# Patient Record
Sex: Female | Born: 1980 | Race: Black or African American | Hispanic: No | Smoking: Never smoker
Health system: Southern US, Community
[De-identification: ages and names within clinical notes are randomized; demographics above are authoritative.]

## PROBLEM LIST (undated history)

## (undated) ENCOUNTER — Ambulatory Visit (HOSPITAL_COMMUNITY): Admission: EM | Payer: 59

## (undated) DIAGNOSIS — E559 Vitamin D deficiency, unspecified: Secondary | ICD-10-CM

## (undated) DIAGNOSIS — R3589 Other polyuria: Secondary | ICD-10-CM

## (undated) DIAGNOSIS — R079 Chest pain, unspecified: Secondary | ICD-10-CM

## (undated) DIAGNOSIS — R5383 Other fatigue: Secondary | ICD-10-CM

## (undated) DIAGNOSIS — I10 Essential (primary) hypertension: Secondary | ICD-10-CM

## (undated) HISTORY — DX: Chest pain, unspecified: R07.9

## (undated) HISTORY — DX: Vitamin D deficiency, unspecified: E55.9

## (undated) HISTORY — DX: Essential (primary) hypertension: I10

## (undated) HISTORY — DX: Other polyuria: R35.89

## (undated) HISTORY — DX: Other fatigue: R53.83

---

## 2000-02-14 ENCOUNTER — Encounter: Admission: RE | Admit: 2000-02-14 | Discharge: 2000-02-14 | Payer: Self-pay | Admitting: General Practice

## 2000-02-14 ENCOUNTER — Encounter: Payer: Self-pay | Admitting: General Practice

## 2001-02-13 ENCOUNTER — Ambulatory Visit (HOSPITAL_COMMUNITY): Admission: RE | Admit: 2001-02-13 | Discharge: 2001-02-13 | Payer: Self-pay | Admitting: *Deleted

## 2001-06-15 ENCOUNTER — Inpatient Hospital Stay (HOSPITAL_COMMUNITY): Admission: AD | Admit: 2001-06-15 | Discharge: 2001-06-17 | Payer: Self-pay | Admitting: *Deleted

## 2002-03-29 ENCOUNTER — Emergency Department (HOSPITAL_COMMUNITY): Admission: EM | Admit: 2002-03-29 | Discharge: 2002-03-29 | Payer: Self-pay | Admitting: Emergency Medicine

## 2004-01-03 ENCOUNTER — Emergency Department (HOSPITAL_COMMUNITY): Admission: EM | Admit: 2004-01-03 | Discharge: 2004-01-03 | Payer: Self-pay | Admitting: Emergency Medicine

## 2004-02-19 ENCOUNTER — Ambulatory Visit (HOSPITAL_COMMUNITY): Admission: AD | Admit: 2004-02-19 | Discharge: 2004-02-19 | Payer: Self-pay | Admitting: Obstetrics & Gynecology

## 2004-02-19 ENCOUNTER — Encounter (INDEPENDENT_AMBULATORY_CARE_PROVIDER_SITE_OTHER): Payer: Self-pay | Admitting: *Deleted

## 2004-08-09 ENCOUNTER — Ambulatory Visit: Payer: Self-pay | Admitting: Obstetrics & Gynecology

## 2004-08-09 ENCOUNTER — Inpatient Hospital Stay (HOSPITAL_COMMUNITY): Admission: AD | Admit: 2004-08-09 | Discharge: 2004-08-09 | Payer: Self-pay | Admitting: Obstetrics and Gynecology

## 2004-08-26 ENCOUNTER — Emergency Department (HOSPITAL_COMMUNITY): Admission: EM | Admit: 2004-08-26 | Discharge: 2004-08-26 | Payer: Self-pay | Admitting: Emergency Medicine

## 2004-09-29 ENCOUNTER — Emergency Department (HOSPITAL_COMMUNITY): Admission: EM | Admit: 2004-09-29 | Discharge: 2004-09-29 | Payer: Self-pay | Admitting: Emergency Medicine

## 2004-11-05 ENCOUNTER — Ambulatory Visit (HOSPITAL_COMMUNITY): Admission: RE | Admit: 2004-11-05 | Discharge: 2004-11-05 | Payer: Self-pay | Admitting: *Deleted

## 2004-12-05 ENCOUNTER — Inpatient Hospital Stay (HOSPITAL_COMMUNITY): Admission: AD | Admit: 2004-12-05 | Discharge: 2004-12-05 | Payer: Self-pay | Admitting: *Deleted

## 2004-12-10 ENCOUNTER — Ambulatory Visit (HOSPITAL_COMMUNITY): Admission: RE | Admit: 2004-12-10 | Discharge: 2004-12-10 | Payer: Self-pay | Admitting: *Deleted

## 2005-05-06 ENCOUNTER — Ambulatory Visit: Payer: Self-pay | Admitting: Family Medicine

## 2005-05-06 ENCOUNTER — Inpatient Hospital Stay (HOSPITAL_COMMUNITY): Admission: AD | Admit: 2005-05-06 | Discharge: 2005-05-08 | Payer: Self-pay | Admitting: Family Medicine

## 2005-05-19 IMAGING — CR DG CHEST 2V
2 series · 2 of 2 positions shown · non-contrast
Comparison: None.

CLINICAL DATA: Chest pain, shortness of breath.

CHEST - 2 VIEW  08/26/2004:

[view not recorded (1 of 2)]
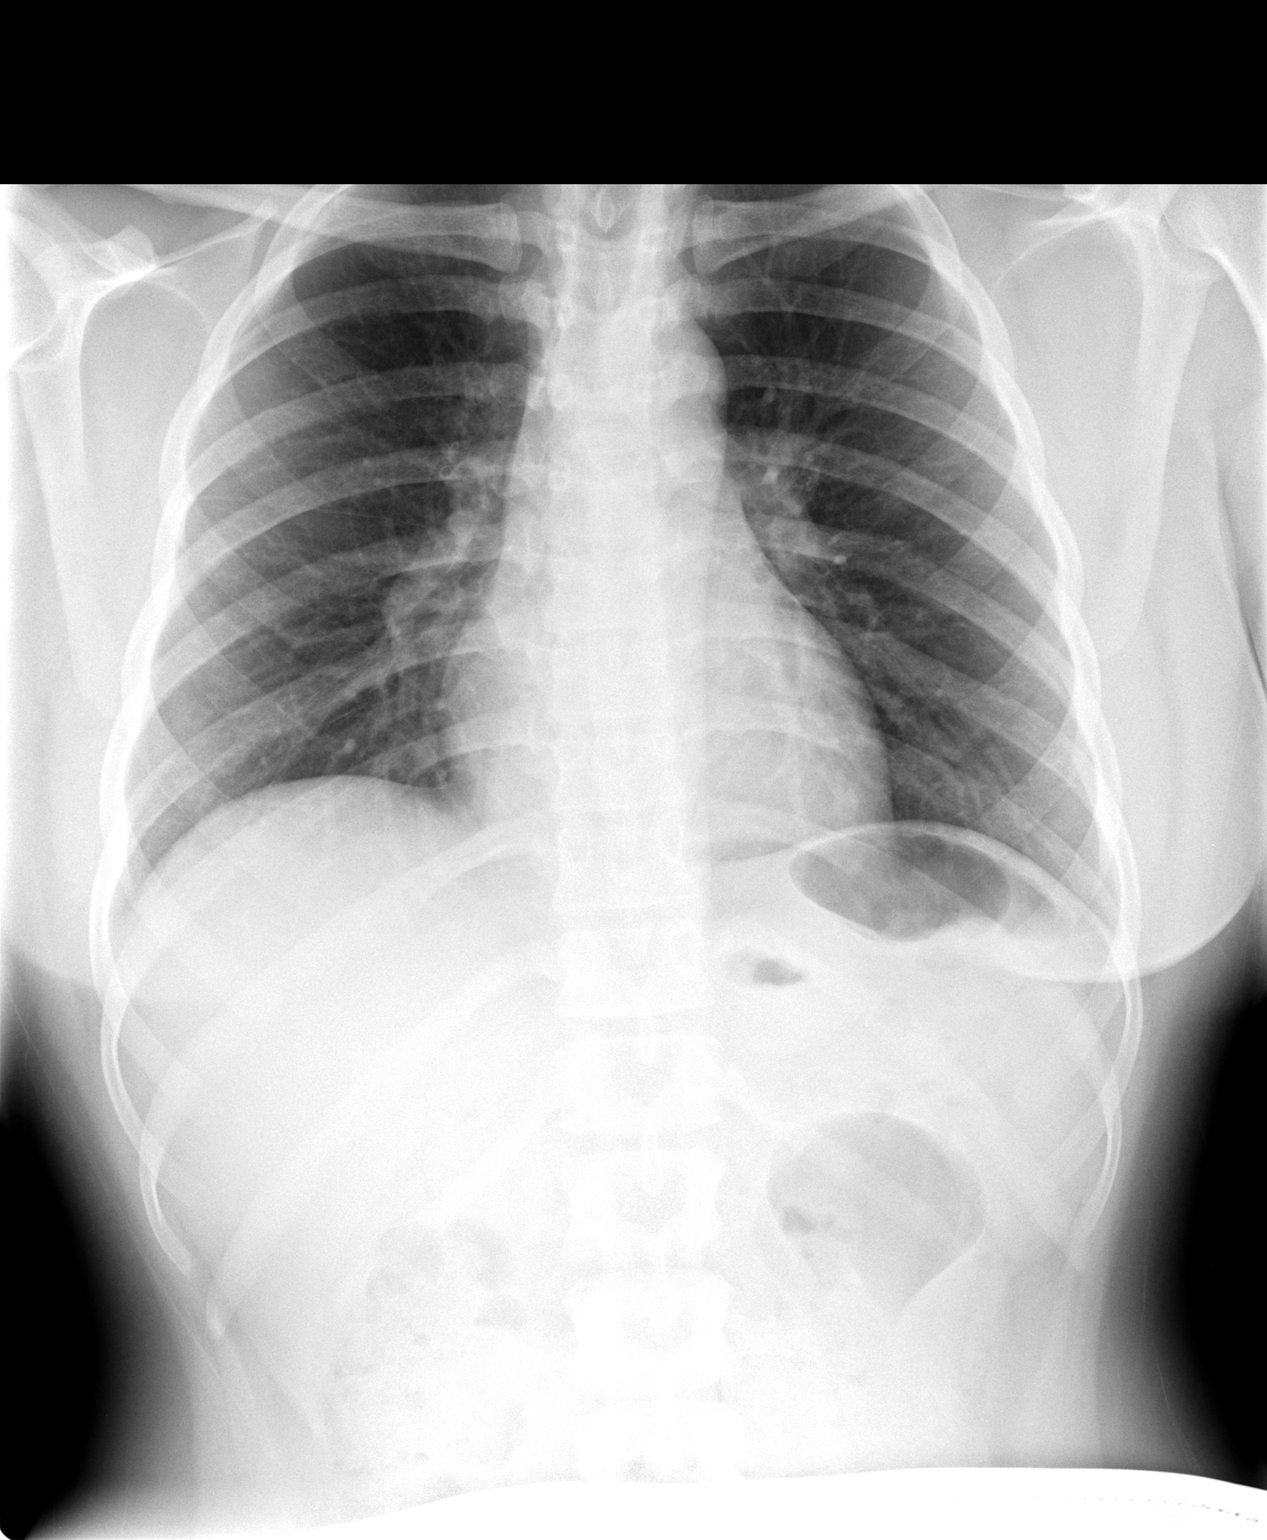

[view not recorded (2 of 2)]
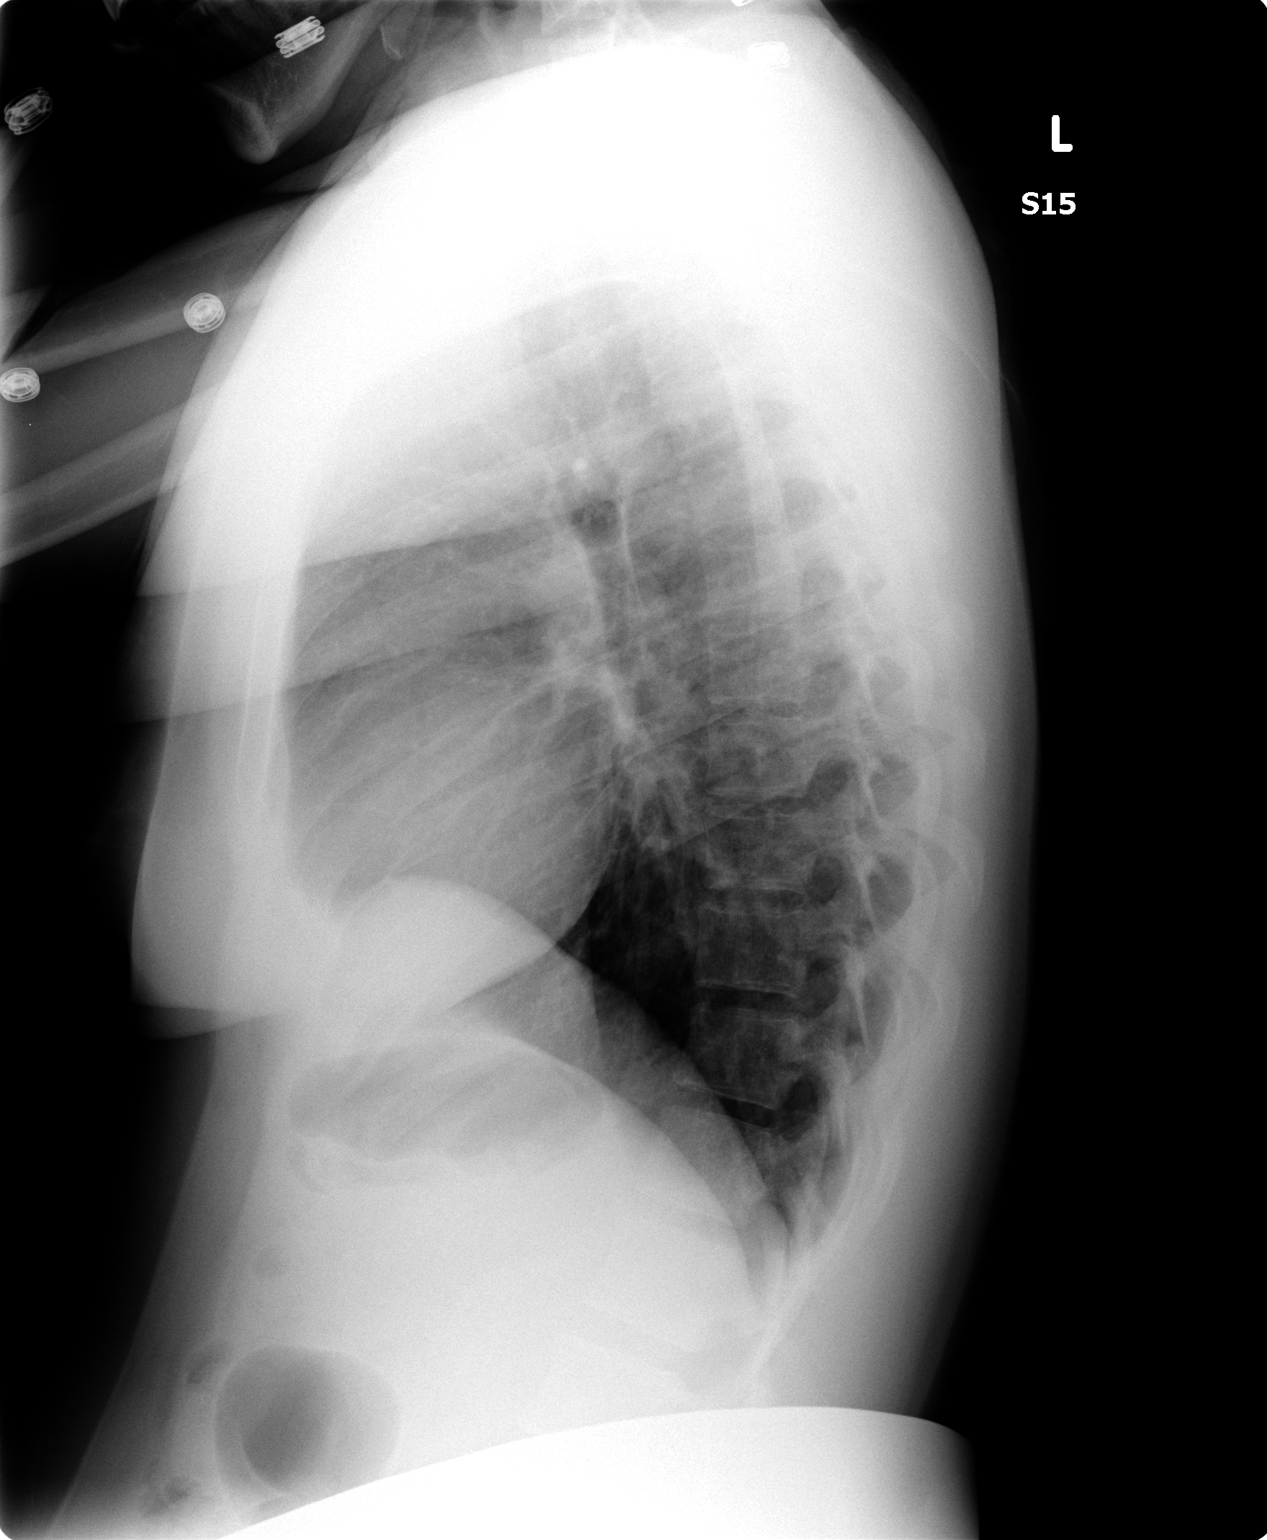

[2 of 2 positions shown; findings below may reference images not displayed]

FINDINGS: The cardiomediastinal silhouette is unremarkable. The lungs appear clear. The visualized
bony thorax appears intact.

IMPRESSION

Normal chest.

## 2009-02-17 ENCOUNTER — Encounter: Admission: RE | Admit: 2009-02-17 | Discharge: 2009-02-17 | Payer: Self-pay | Admitting: Pulmonary Disease

## 2010-07-24 ENCOUNTER — Emergency Department (HOSPITAL_COMMUNITY): Admission: EM | Admit: 2010-07-24 | Discharge: 2010-07-24 | Payer: Self-pay | Admitting: Family Medicine

## 2010-07-27 ENCOUNTER — Emergency Department (HOSPITAL_COMMUNITY): Admission: EM | Admit: 2010-07-27 | Discharge: 2010-07-27 | Payer: Self-pay | Admitting: Emergency Medicine

## 2011-03-18 NOTE — Op Note (Signed)
Lauren Zuniga, Lauren Zuniga                          ACCOUNT NO.:  [id number]   MEDICAL RECORD NO.:  [id number]                   PATIENT TYPE:  AMB   LOCATION:  [facility]                                 FACILITY:  [facility]   PHYSICIAN:  Malva Limes, M.D.                 DATE OF BIRTH:  03-01-1981   DATE OF PROCEDURE:  02/19/2004  DATE OF DISCHARGE:                                 OPERATIVE REPORT   PREOPERATIVE DIAGNOSIS:  Incomplete abortion.   POSTOPERATIVE DIAGNOSIS:  Incomplete abortion.   PROCEDURE:  Dilatation and evacuation.   SURGEON:  Malva Limes, M.D.   ANESTHESIA:  MAC with paracervical block.   ANTIBIOTICS:  Ancef 1 gram.   ESTIMATED BLOOD LOSS:  25 mL.   COMPLICATIONS:  None.   SPECIMENS:  Products of conception sent to pathology.   PROCEDURE:  The patient was taken to the operating room where she was placed  in the dorsal lithotomy position.  The patient was prepped with Betadine and  draped in the usual fashion for this procedure.  A sterile speculum was  placed in the vagina.  10 mL of 1% lidocaine was used for a paracervical  block.  The single tooth tenaculum was applied to the anterior cervical lip.  The cervical os was dilated to a 31 [place].  An 8 mm suction cannula was  placed into the uterine cavity and products of conception withdrawn.  Sharp  curettage was performed followed by repeat suction.  The patient tolerated  the procedure well.  She was taken to the recovery room in stable condition.  Instrument and lap counts were correct x 1.   The patient will be discharged to home.  She will be sent home on Keflex 500  mg p.o. q.i.d. for two days and Anaprox DS.  The patient will be given  RhoGAM if Rh negative.                                               Malva Limes, M.D.    MA/MEDQ  D:  02/19/2004  T:  02/19/2004  Job:  [id number]

## 2019-03-02 ENCOUNTER — Encounter (HOSPITAL_COMMUNITY): Payer: Self-pay | Admitting: *Deleted

## 2019-03-02 ENCOUNTER — Emergency Department (HOSPITAL_COMMUNITY): Payer: BLUE CROSS/BLUE SHIELD

## 2019-03-02 ENCOUNTER — Other Ambulatory Visit: Payer: Self-pay

## 2019-03-02 ENCOUNTER — Emergency Department (HOSPITAL_COMMUNITY)
Admission: EM | Admit: 2019-03-02 | Discharge: 2019-03-02 | Disposition: A | Discharge status: Home / Self Care | Payer: BLUE CROSS/BLUE SHIELD | Attending: Emergency Medicine | Admitting: Emergency Medicine

## 2019-03-02 DIAGNOSIS — D573 Sickle-cell trait: Secondary | ICD-10-CM | POA: Insufficient documentation

## 2019-03-02 DIAGNOSIS — R070 Pain in throat: Secondary | ICD-10-CM | POA: Diagnosis present

## 2019-03-02 DIAGNOSIS — R079 Chest pain, unspecified: Secondary | ICD-10-CM | POA: Diagnosis not present

## 2019-03-02 DIAGNOSIS — R0789 Other chest pain: Secondary | ICD-10-CM | POA: Diagnosis not present

## 2019-03-02 LAB — COMPREHENSIVE METABOLIC PANEL
ALT: 15 U/L (ref 0–44)
AST: 20 U/L (ref 15–41)
Albumin: 3.8 g/dL (ref 3.5–5.0)
Alkaline Phosphatase: 42 U/L (ref 38–126)
Anion gap: 9 (ref 5–15)
BUN: <5 mg/dL — ABNORMAL LOW (ref 6–20)
CO2: 22 mmol/L (ref 22–32)
Calcium: 9.2 mg/dL (ref 8.9–10.3)
Chloride: 106 mmol/L (ref 98–111)
Creatinine, Ser: 0.62 mg/dL (ref 0.44–1.00)
GFR calc Af Amer: >60 mL/min (ref >60)
GFR calc non Af Amer: >60 mL/min (ref >60)
Glucose, Bld: 88 mg/dL (ref 70–99)
Potassium: 3.8 mmol/L (ref 3.5–5.1)
Sodium: 137 mmol/L (ref 135–145)
Total Bilirubin: 0.6 mg/dL (ref 0.3–1.2)
Total Protein: 7.7 g/dL (ref 6.5–8.1)

## 2019-03-02 LAB — CBC WITH DIFFERENTIAL/PLATELET
Abs Immature Granulocytes: 0.02 10*3/uL (ref 0.00–0.07)
Basophils Absolute: 0 10*3/uL (ref 0.0–0.1)
Basophils Relative: 1 %
Eosinophils Absolute: 0.1 10*3/uL (ref 0.0–0.5)
Eosinophils Relative: 1 %
HCT: 36.4 % (ref 36.0–46.0)
Hemoglobin: 12 g/dL (ref 12.0–15.0)
Immature Granulocytes: 0 %
Lymphocytes Relative: 42 %
Lymphs Abs: 3.2 10*3/uL (ref 0.7–4.0)
MCH: 25 pg — ABNORMAL LOW (ref 26.0–34.0)
MCHC: 33 g/dL (ref 30.0–36.0)
MCV: 75.8 fL — ABNORMAL LOW (ref 80.0–100.0)
Monocytes Absolute: 0.6 10*3/uL (ref 0.1–1.0)
Monocytes Relative: 8 %
Neutro Abs: 3.6 10*3/uL (ref 1.7–7.7)
Neutrophils Relative %: 48 %
Platelets: 270 10*3/uL (ref 150–400)
RBC: 4.8 MIL/uL (ref 3.87–5.11)
RDW: 15.9 % — ABNORMAL HIGH (ref 11.5–15.5)
WBC: 7.6 10*3/uL (ref 4.0–10.5)
nRBC: 0 % (ref 0.0–0.2)

## 2019-03-02 LAB — TROPONIN I: Troponin I: <0.03 ng/mL (ref <0.03)

## 2019-03-02 LAB — I-STAT BETA HCG BLOOD, ED (MC, WL, AP ONLY): I-stat hCG, quantitative: <5 m[IU]/mL (ref <5)

## 2019-03-02 MED ORDER — LIDOCAINE VISCOUS HCL 2 % MT SOLN
15.0000 mL | Freq: Once | OROMUCOSAL | Status: AC
  Administered 2019-03-02: 15:00:00 15 mL via ORAL
  Filled 2019-03-02: qty 15

## 2019-03-02 MED ORDER — ALUM & MAG HYDROXIDE-SIMETH 200-200-20 MG/5ML PO SUSP
30.0000 mL | Freq: Once | ORAL | Status: AC
  Administered 2019-03-02: 30 mL via ORAL
  Filled 2019-03-02: qty 30

## 2019-03-02 MED ORDER — OMEPRAZOLE 20 MG PO CPDR: 20.0000 mg | DELAYED_RELEASE_CAPSULE | Freq: Every day | ORAL | 0 refills | Status: DC

## 2019-03-02 NOTE — ED Triage Notes (Signed)
Pt reports CP starting Friday and has a dry throat .

## 2019-03-02 NOTE — ED Provider Notes (Signed)
[facility] Note   CSN: [id number] Arrival date & time: 03/02/19  1326    History   Chief Complaint Chief Complaint  Patient presents with   Sore Throat   Chest Pain    HPI Lauren Zuniga is a 38 y.o. female with a PMH of sickle cell trait presenting with intermittent central non radiating chest pain onset yesterday. Patient states chest pain is sharp and episodes last a few seconds. Patient states last episode was at 10am today. Patient also reports she does not have chest pain currently. Patient reports pain is worse with clearing throat. Patient states she has tried lemon and hot water without relief.  Patient states she also has dryness in her throat. Patient denies any pain. Patient denies fever, chills, cough, shortness of breath, nausea, vomiting, or diarrhea. Patient denies sick contacts, known exposure to COVID-19, or recent travel. Patient denies using OCPs, recent travel, recent surgery, leg edema/pain, or hx of DVT/PE. Patient denies a personal or family history of cardiac problems. Patient denies alcohol, tobacco, or drug use.       HPI  History reviewed. No pertinent past medical history.  There are no active problems to display for this patient.   History reviewed. No pertinent surgical history.   OB History    Gravida  1   Para      Term      Preterm      AB      Living        SAB      TAB      Ectopic      Multiple      Live Births               Home Medications    Prior to Admission medications   Not on File    Family History History reviewed. No pertinent family history.  Social History Social History   Tobacco Use   Smoking status: Never Smoker   Smokeless tobacco: Never Used  Substance Use Topics   Alcohol use: Never    Frequency: Never   Drug use: Never     Allergies   Patient has no known allergies.   Review of Systems Review of Systems  Constitutional:  Negative for activity change, appetite change, chills, diaphoresis, fatigue, fever and unexpected weight change.  HENT: Negative for congestion, rhinorrhea and sore throat.        Patient reports throat dryness.  Respiratory: Negative for cough, chest tightness, shortness of breath and wheezing.   Cardiovascular: Positive for chest pain. Negative for palpitations and leg swelling.  Gastrointestinal: Negative for abdominal pain, nausea and vomiting.  Endocrine: Negative for cold intolerance and heat intolerance.  Musculoskeletal: Negative for back pain.  Skin: Negative for rash.  Allergic/Immunologic: Negative for immunocompromised state.  Neurological: Negative for dizziness, syncope, weakness and light-headedness.  Psychiatric/Behavioral: Negative for agitation and behavioral problems. The patient is not nervous/anxious.      Physical Exam Updated Vital Signs BP (!) 141/96 (BP Location: Right Arm)    Pulse 86    Temp 98.7 F (37.1 C) (Oral)    Resp 20    Ht 5\' 5"  (1.651 m)    Wt 84.8 kg    LMP 02/03/2019    SpO2 97%    BMI 31.12 kg/m   Physical Exam Vitals signs and nursing note reviewed.  Constitutional:      General: She is not in acute distress.  Appearance: She is well-developed. She is not diaphoretic.     Comments: Patient is sitting up in bed in no acute distress.  HENT:     Head: Normocephalic and atraumatic.     Right Ear: Tympanic membrane and ear canal normal.     Left Ear: Tympanic membrane and ear canal normal.     Nose: No congestion or rhinorrhea.     Mouth/Throat:     Mouth: Mucous membranes are moist.     Pharynx: No oropharyngeal exudate, posterior oropharyngeal erythema or uvula swelling.  Eyes:     Conjunctiva/sclera: Conjunctivae normal.  Neck:     Musculoskeletal: Normal range of motion and neck supple.     Vascular: No JVD.  Cardiovascular:     Rate and Rhythm: Normal rate and regular rhythm.     Pulses: Normal pulses.          Radial pulses are 2+  on the right side and 2+ on the left side.       Dorsalis pedis pulses are 2+ on the right side and 2+ on the left side.     Heart sounds: Normal heart sounds. No murmur. No friction rub. No gallop.   Pulmonary:     Effort: Pulmonary effort is normal. No respiratory distress.     Breath sounds: Normal breath sounds. No wheezing, rhonchi or rales.  Chest:     Chest wall: No tenderness.  Abdominal:     Palpations: Abdomen is soft.     Tenderness: There is no abdominal tenderness.  Musculoskeletal: Normal range of motion.     Right lower leg: She exhibits no tenderness. No edema.     Left lower leg: She exhibits no tenderness. No edema.  Skin:    Capillary Refill: Capillary refill takes less than 2 seconds.     Coloration: Skin is not pale.     Findings: No rash.  Neurological:     Mental Status: She is alert.    ED Treatments / Results  Labs (all labs ordered are listed, but only abnormal results are displayed) Labs Reviewed  COMPREHENSIVE METABOLIC PANEL - Abnormal; Notable for the following components:      Result Value   BUN <5 (*)    All other components within normal limits  CBC WITH DIFFERENTIAL/PLATELET - Abnormal; Notable for the following components:   MCV 75.8 (*)    MCH 25.0 (*)    RDW 15.9 (*)    All other components within normal limits  TROPONIN I  I-STAT BETA HCG BLOOD, ED ([facility], [facility], [facility] ONLY)    EKG EKG Interpretation  Date/Time:  Saturday Mar 02 2019 13:42:57 EDT Ventricular Rate:  82 PR Interval:  152 QRS Duration: 84 QT Interval:  376 QTC Calculation: 439 R Axis:   25 Text Interpretation:  Normal sinus rhythm Normal ECG agree, no change Confirmed by Arby Barrette [phone]) on 03/02/2019 3:00:07 PM   Radiology Dg Chest Port 1 View  Result Date: 03/02/2019 CLINICAL DATA:  Chest pain EXAM: PORTABLE CHEST - 1 VIEW COMPARISON:  02/17/2009 FINDINGS: Lungs are clear. Heart size and mediastinal contours are within normal limits. No effusion.  No pneumothorax.  Visualized bones unremarkable. IMPRESSION: No acute cardiopulmonary disease. Electronically Signed   By: Corlis Leak M.D.   On: 03/02/2019 14:05    Procedures Procedures (including critical care time)  Medications Ordered in ED Medications  alum & mag hydroxide-simeth (MAALOX/MYLANTA) 200-200-20 MG/5ML suspension 30 mL (30 mLs Oral Given 03/02/19 1432)  And  lidocaine (XYLOCAINE) 2 % viscous mouth solution 15 mL (15 mLs Oral Given 03/02/19 1432)     Initial Impression / Assessment and Plan / ED Course  I have reviewed the triage vital signs and the nursing notes.  Pertinent labs & imaging results that were available during my care of the patient were reviewed by me and considered in my medical decision making (see chart for details).  Clinical Course as of Mar 01 1508  Sat Mar 02, 2019  1411 No acute cardiopulmonary disease noted on CXR.  DG Chest Port 1 View [AH]    Clinical Course User Index [AH] Leretha Dykes, [place]      Patient is to be discharged with recommendation to follow up with PCP in regards to today's hospital visit. Chest pain is not likely of cardiac or pulmonary etiology d/t presentation, PERC negative, VSS, no tracheal deviation, no JVD or new murmur, RRR, breath sounds equal bilaterally, EKG without acute abnormalities, negative troponin, and negative CXR. Patient has not had any chest pain while in the ER. Provided GI cocktail and patient states it helped her symptoms. Pt has been advised to return to the ED if CP becomes exertional, associated with diaphoresis or nausea, radiates to left jaw/arm, worsens or becomes concerning in any way. Pt appears reliable for follow up and is agreeable to discharge.   Final Clinical Impressions(s) / ED Diagnoses   Final diagnoses:  Atypical chest pain    ED Discharge Orders    None       Leretha Dykes, PA-C 03/02/19 1509    Arby Barrette, MD 03/11/19 [phone]

## 2019-03-02 NOTE — Discharge Instructions (Addendum)
You have been seen today for chest pain. Please read and follow all provided instructions.   1. Medications: may try to use pepcid over the counter for symptoms, usual home medications 2. Treatment: rest, drink plenty of fluids 3. Follow Up: Please follow up with your primary doctor in 2 days for discussion of your diagnoses and further evaluation after today's visit; if you do not have a primary care doctor use the resource guide provided to find one; Please return to the ER for any new or worsening symptoms. Please obtain all of your results from medical records or have your doctors office obtain the results - share them with your doctor - you should be seen at your doctors office. Call today to arrange your follow up.   Take medications as prescribed. Please review all of the medicines and only take them if you do not have an allergy to them. Return to the emergency room for worsening condition or new concerning symptoms. Follow up with your regular doctor. If you don't have a regular doctor use one of the numbers below to establish a primary care doctor.  Please be aware that if you are taking birth control pills, taking other prescriptions, ESPECIALLY ANTIBIOTICS may make the birth control ineffective - if this is the case, either do not engage in sexual activity or use alternative methods of birth control such as condoms until you have finished the medicine and your family doctor says it is OK to restart them. If you are on a blood thinner such as COUMADIN, be aware that any other medicine that you take may cause the coumadin to either work too much, or not enough - you should have your coumadin level rechecked in next 7 days if this is the case.  ?  It is also a possibility that you have an allergic reaction to any of the medicines that you have been prescribed - Everybody reacts differently to medications and while MOST people have no trouble with most medicines, you may have a reaction such as  nausea, vomiting, rash, swelling, shortness of breath. If this is the case, please stop taking the medicine immediately and contact your physician.  ?  You should return to the ER if you develop severe or worsening symptoms.   Emergency Department Resource Guide 1) Find a Doctor and Pay Out of Pocket Although you won't have to find out who is covered by your insurance plan, it is a good idea to ask around and get recommendations. You will then need to call the office and see if the doctor you have chosen will accept you as a new patient and what types of options they offer for patients who are self-pay. Some doctors offer discounts or will set up payment plans for their patients who do not have insurance, but you will need to ask so you aren't surprised when you get to your appointment.  2) Contact Your Local Health Department Not all health departments have doctors that can see patients for sick visits, but many do, so it is worth a call to see if yours does. If you don't know where your local health department is, you can check in your phone book. The CDC also has a tool to help you locate your state's health department, and many state websites also have listings of all of their local health departments.  3) Find a Walk-in Clinic If your illness is not likely to be very severe or complicated, you may want to try  a walk in clinic. These are popping up all over the country in pharmacies, drugstores, and shopping centers. They're usually staffed by nurse practitioners or physician assistants that have been trained to treat common illnesses and complaints. They're usually fairly quick and inexpensive. However, if you have serious medical issues or chronic medical problems, these are probably not your best option.  No Primary Care Doctor: Call Health Connect at  [phone] - they can help you locate a primary care doctor that  accepts your insurance, provides certain services, etc. Physician Referral  Service- [phone]  Chronic Pain Problems: Organization         Address  Phone   Notes  [facility]  [phone] Patients need to be referred by their primary care doctor.   Medication Assistance: Organization         Address  Phone   Notes  [facility] Medication [facility] [address] [place], [place] [address] [phone] --Must be a resident of [facility] -- Must have NO insurance coverage whatsoever (no Medicaid/ Medicare, etc.) -- The pt. MUST have a primary care doctor that directs their care regularly and follows them in the community   [facility]  [phone]   [employer]  [phone]    Agencies that provide inexpensive medical care: Organization         Address  Phone   Notes  [place]  [phone]   [facility]    [phone]   [place],  [phone]   [place] [address] [place] [phone]   [facility]    [phone]   [facility]    [phone]   [place] [address] [place] [address], [place] Phone:  [phone], Fax:  [phone] Hours of Operation:  9 am - 6 pm, M-F.  Also accepts Medicaid/Medicare and self-pay.  [facility] for [place], [address], [place] Phone: [phone], Fax: [phone]. Hours of Operation:  8:30 am - 5:30 pm, M-F.  Also accepts Medicaid and self-pay.  [facility] [place] [address] [place] Phone: [phone]   [place], [place], [place] [phone], Ext. [phone] Mondays & Thursdays: 7-9 AM.  First 15 patients are seen on a first come, first serve basis.    [place] Providers:  Organization         Address  Phone   Notes  [facility] [address], [place] [phone] Also accepts self-pay patients.  [facility] [address], [place]  [phone]   [place], [address], [place] [phone]   [facility] [address] [place] [phone]   Lucianne Lei [address], [place]   [phone] Only accepts [place] patients after they have their name applied to their card.   Self-Pay (no insurance) in [facility]:  Organization         Address  Phone   Notes  Sickle Cell Patients, [facility] [place] [phone]   [facility] [place] [phone]   [facility] [place]  [place], [address], [place] [phone]   [facility]/Dr. Osei-Bonsu  [address] [place] or [address], [place] [phone] Phone number for both [place] and [place] locations is the same.  Urgent Medical and [facility] [address], [place] [phone]   [facility] [address] [place] or [address] [phone]   [facility] [address] [place] [phone], phone; [phone], fax Sees patients 1st and 3rd Saturday of every month.  Must not qualify for public or private insurance (i.e. Medicaid, Medicare, [place] Health Choice, Veterans' Benefits)  Household income should be no more than 200% of the poverty level The clinic cannot treat you if you are pregnant or think you are pregnant  Sexually transmitted diseases are not treated at the clinic.

## 2019-03-02 NOTE — ED Notes (Signed)
Patient verbalizes understanding of discharge instructions . Opportunity for questions and answers were provided . Armband removed by staff ,Pt discharged from ED. W/C  offered at D/C  and Declined W/C at D/C and was escorted to lobby by RN.  

## 2020-02-06 ENCOUNTER — Other Ambulatory Visit: Payer: Self-pay

## 2020-02-07 ENCOUNTER — Other Ambulatory Visit: Payer: Self-pay | Admitting: Internal Medicine

## 2020-02-07 ENCOUNTER — Encounter: Payer: Self-pay | Admitting: Internal Medicine

## 2020-02-07 ENCOUNTER — Ambulatory Visit (INDEPENDENT_AMBULATORY_CARE_PROVIDER_SITE_OTHER): Payer: 59 | Admitting: Internal Medicine

## 2020-02-07 VITALS — BP 110/80 | HR 72 | Temp 97.6°F | Ht 77.0 in | Wt 198.2 lb

## 2020-02-07 DIAGNOSIS — E559 Vitamin D deficiency, unspecified: Secondary | ICD-10-CM

## 2020-02-07 DIAGNOSIS — R5383 Other fatigue: Secondary | ICD-10-CM

## 2020-02-07 DIAGNOSIS — R531 Weakness: Secondary | ICD-10-CM

## 2020-02-07 DIAGNOSIS — D509 Iron deficiency anemia, unspecified: Secondary | ICD-10-CM | POA: Insufficient documentation

## 2020-02-07 LAB — TSH: TSH: 0.75 u[IU]/mL (ref 0.35–4.50)

## 2020-02-07 LAB — COMPREHENSIVE METABOLIC PANEL
ALT: 9 U/L (ref 0–35)
AST: 14 U/L (ref 0–37)
Albumin: 4.1 g/dL (ref 3.5–5.2)
Alkaline Phosphatase: 45 U/L (ref 39–117)
BUN: 5 mg/dL — ABNORMAL LOW (ref 6–23)
CO2: 28 mEq/L (ref 19–32)
Calcium: 9 mg/dL (ref 8.4–10.5)
Chloride: 105 mEq/L (ref 96–112)
Creatinine, Ser: 0.72 mg/dL (ref 0.40–1.20)
GFR: 108.77 mL/min (ref >60.00)
Glucose, Bld: 90 mg/dL (ref 70–99)
Potassium: 4 mEq/L (ref 3.5–5.1)
Sodium: 139 mEq/L (ref 135–145)
Total Bilirubin: 0.5 mg/dL (ref 0.2–1.2)
Total Protein: 7 g/dL (ref 6.0–8.3)

## 2020-02-07 LAB — CBC WITH DIFFERENTIAL/PLATELET
Basophils Absolute: 0 10*3/uL (ref 0.0–0.1)
Basophils Relative: 0.6 % (ref 0.0–3.0)
Eosinophils Absolute: 0.1 10*3/uL (ref 0.0–0.7)
Eosinophils Relative: 2.3 % (ref 0.0–5.0)
HCT: 34.6 % — ABNORMAL LOW (ref 36.0–46.0)
Hemoglobin: 11.2 g/dL — ABNORMAL LOW (ref 12.0–15.0)
Lymphocytes Relative: 43.3 % (ref 12.0–46.0)
Lymphs Abs: 2.3 10*3/uL (ref 0.7–4.0)
MCHC: 32.4 g/dL (ref 30.0–36.0)
MCV: 74.6 fl — ABNORMAL LOW (ref 78.0–100.0)
Monocytes Absolute: 0.4 10*3/uL (ref 0.1–1.0)
Monocytes Relative: 7.9 % (ref 3.0–12.0)
Neutro Abs: 2.5 10*3/uL (ref 1.4–7.7)
Neutrophils Relative %: 45.9 % (ref 43.0–77.0)
Platelets: 264 10*3/uL (ref 150.0–400.0)
RBC: 4.63 Mil/uL (ref 3.87–5.11)
RDW: 18.1 % — ABNORMAL HIGH (ref 11.5–15.5)
WBC: 5.4 10*3/uL (ref 4.0–10.5)

## 2020-02-07 LAB — VITAMIN D 25 HYDROXY (VIT D DEFICIENCY, FRACTURES): VITD: 17.56 ng/mL — ABNORMAL LOW (ref 30.00–100.00)

## 2020-02-07 LAB — VITAMIN B12: Vitamin B-12: 748 pg/mL (ref 211–911)

## 2020-02-07 MED ORDER — VITAMIN D (ERGOCALCIFEROL) 1.25 MG (50000 UNIT) PO CAPS: 50000.0000 [IU] | ORAL_CAPSULE | ORAL | 0 refills | Status: AC

## 2020-02-07 NOTE — Progress Notes (Signed)
New Patient Office Visit     This visit occurred during the SARS-CoV-2 public health emergency.  Safety protocols were in place, including screening questions prior to the visit, additional usage of staff PPE, and extensive cleaning of exam room while observing appropriate contact time as indicated for disinfecting solutions.    CC/Reason for Visit: Establish care, discuss acute concerns Previous PCP: None Last Visit: Unknown  HPI: Lauren Zuniga is a 39 y.o. female who is coming in today for the above mentioned reasons.  She has no significant past medical history but has not seen a primary doctor in years.  She saw the ED last year with some mild chest pain thought to be reflux given omeprazole without any relief she quit taking this months ago.  She has not had chest pain since.  She does not smoke, she does not drink, she works in an Designer, television/film set for [employer], she has 4 children ages 37-14, no known drug allergies, she has had no surgical history, only medication she takes is a gummy vitamin.  She is originally from [place] but has been in the [place] for about 22 years.  Family history is only significant for paternal grandmother who is deceased from unknown cancer.  She is here today because she has been feeling very fatigued and weak.  This has been going on for months.  She feels like she does not get a good night sleep.  She has no focal weakness, no chest pain, no shortness of breath.  She denies balance issues or numbness or tingling of her fingertips or toes.  Past Medical/Surgical History: History reviewed. No pertinent past medical history.  History reviewed. No pertinent surgical history.  Social History:  reports that she has never smoked. She has never used smokeless tobacco. She reports that she does not drink alcohol or use drugs.  Allergies: No Known Allergies  Family History:  Family History  Problem Relation Age of Onset  . Cancer Paternal  Grandmother     No current outpatient medications on file.  Review of Systems:  Constitutional: Denies fever, chills, diaphoresis, appetite change. HEENT: Denies photophobia, eye pain, redness, hearing loss, ear pain, congestion, sore throat, rhinorrhea, sneezing, mouth sores, trouble swallowing, neck pain, neck stiffness and tinnitus.   Respiratory: Denies SOB, DOE, cough, chest tightness,  and wheezing.   Cardiovascular: Denies chest pain, palpitations and leg swelling.  Gastrointestinal: Denies nausea, vomiting, abdominal pain, diarrhea, constipation, blood in stool and abdominal distention.  Genitourinary: Denies dysuria, urgency, frequency, hematuria, flank pain and difficulty urinating.  Endocrine: Denies: hot or cold intolerance, sweats, changes in hair or nails, polyuria, polydipsia. Musculoskeletal: Denies myalgias, back pain, joint swelling, arthralgias and gait problem.  Skin: Denies pallor, rash and wound.  Neurological: Denies dizziness, seizures, syncope, weakness, light-headedness, numbness and headaches.  Hematological: Denies adenopathy. Easy bruising, personal or family bleeding history  Psychiatric/Behavioral: Denies suicidal ideation, mood changes, confusion, nervousness and agitation    Physical Exam: Vitals:   02/07/20 0815  BP: 110/80  Pulse: 72  Temp: 97.6 F (36.4 C)  TempSrc: Temporal  SpO2: 95%  Weight: 198 lb 3.2 oz (89.9 kg)  Height: 6\' 5"  (1.956 m)   Body mass index is 23.5 kg/m.  Constitutional: NAD, calm, comfortable Eyes: PERRL, lids and conjunctivae normal ENMT: Mucous membranes are moist.  Respiratory: clear to auscultation bilaterally, no wheezing, no crackles. Normal respiratory effort. No accessory muscle use.  Cardiovascular: Regular rate and rhythm, no murmurs /  rubs / gallops. No extremity edema.  Neurologic: Grossly intact and nonfocal Psychiatric: Normal judgment and insight. Alert and oriented x 3. Normal mood.    Impression and  Plan:  Fatigue, unspecified type  Generalized weakness  -Will start work-up today by checking basic labs including CBC to rule out anemia, CMP, TSH, vitamin D, vitamin B12. -Further work-up pending results.    Patient Instructions  -Nice seeing you today!!  -Lab work today; will notify you once results are available.  -Schedule follow up visit in 4 months for your physical. Please come in fasting that day.     Chaya Jan, MD [facility]

## 2020-02-07 NOTE — Patient Instructions (Signed)
-  Nice seeing you today!!  -Lab work today; will notify you once results are available.  -Schedule follow up visit in 4 months for your physical. Please come in fasting that day.

## 2020-02-21 ENCOUNTER — Telehealth: Payer: Self-pay | Admitting: Internal Medicine

## 2020-02-21 NOTE — Telephone Encounter (Signed)
ferrous sulfate 325 mg daily. This is OTC. Left detailed message on machine per patient request.

## 2020-02-21 NOTE — Telephone Encounter (Signed)
I Pt would like to know what medication she is supposed to take regarding her iron levels being low. Please call [phone] may leave a message due to her being at work.

## 2020-06-10 ENCOUNTER — Ambulatory Visit (INDEPENDENT_AMBULATORY_CARE_PROVIDER_SITE_OTHER): Payer: 59 | Admitting: Internal Medicine

## 2020-06-10 ENCOUNTER — Encounter: Payer: Self-pay | Admitting: Internal Medicine

## 2020-06-10 ENCOUNTER — Other Ambulatory Visit: Payer: Self-pay

## 2020-06-10 VITALS — BP 130/94 | HR 72 | Temp 98.5°F | Ht 64.0 in | Wt 197.9 lb

## 2020-06-10 DIAGNOSIS — D509 Iron deficiency anemia, unspecified: Secondary | ICD-10-CM

## 2020-06-10 DIAGNOSIS — E559 Vitamin D deficiency, unspecified: Secondary | ICD-10-CM | POA: Diagnosis not present

## 2020-06-10 DIAGNOSIS — R030 Elevated blood-pressure reading, without diagnosis of hypertension: Secondary | ICD-10-CM

## 2020-06-10 DIAGNOSIS — E669 Obesity, unspecified: Secondary | ICD-10-CM

## 2020-06-10 DIAGNOSIS — Z0000 Encounter for general adult medical examination without abnormal findings: Secondary | ICD-10-CM | POA: Diagnosis not present

## 2020-06-10 DIAGNOSIS — E66811 Obesity, class 1: Secondary | ICD-10-CM

## 2020-06-10 NOTE — Addendum Note (Signed)
Addended by: Lerry Liner on: 06/10/2020 09:49 AM   Modules accepted: Orders

## 2020-06-10 NOTE — Patient Instructions (Signed)
-Nice seeing you today!!  -Lab work today; will notify you once results are available.  -Make sure you bring your immunization records when you return.  -Purchase a blood pressure cuff and measure your BP 2-3 times a week.  -Return in 8 weeks for blood pressure check.   Preventive Care 14-40 Years Old, Female Preventive care refers to visits with your health care provider and lifestyle choices that can promote health and wellness. This includes:  A yearly physical exam. This may also be called an annual well check.  Regular dental visits and eye exams.  Immunizations.  Screening for certain conditions.  Healthy lifestyle choices, such as eating a healthy diet, getting regular exercise, not using drugs or products that contain nicotine and tobacco, and limiting alcohol use. What can I expect for my preventive care visit? Physical exam Your health care provider will check your:  Height and weight. This may be used to calculate body mass index (BMI), which tells if you are at a healthy weight.  Heart rate and blood pressure.  Skin for abnormal spots. Counseling Your health care provider may ask you questions about your:  Alcohol, tobacco, and drug use.  Emotional well-being.  Home and relationship well-being.  Sexual activity.  Eating habits.  Work and work Statistician.  Method of birth control.  Menstrual cycle.  Pregnancy history. What immunizations do I need?  Influenza (flu) vaccine  This is recommended every year. Tetanus, diphtheria, and pertussis (Tdap) vaccine  You may need a Td booster every 10 years. Varicella (chickenpox) vaccine  You may need this if you have not been vaccinated. Human papillomavirus (HPV) vaccine  If recommended by your health care provider, you may need three doses over 6 months. Measles, mumps, and rubella (MMR) vaccine  You may need at least one dose of MMR. You may also need a second dose. Meningococcal conjugate  (MenACWY) vaccine  One dose is recommended if you are age 72-21 years and a first-year college student living in a residence hall, or if you have one of several medical conditions. You may also need additional booster doses. Pneumococcal conjugate (PCV13) vaccine  You may need this if you have certain conditions and were not previously vaccinated. Pneumococcal polysaccharide (PPSV23) vaccine  You may need one or two doses if you smoke cigarettes or if you have certain conditions. Hepatitis A vaccine  You may need this if you have certain conditions or if you travel or work in places where you may be exposed to hepatitis A. Hepatitis B vaccine  You may need this if you have certain conditions or if you travel or work in places where you may be exposed to hepatitis B. Haemophilus influenzae type b (Hib) vaccine  You may need this if you have certain conditions. You may receive vaccines as individual doses or as more than one vaccine together in one shot (combination vaccines). Talk with your health care provider about the risks and benefits of combination vaccines. What tests do I need?  Blood tests  Lipid and cholesterol levels. These may be checked every 5 years starting at age 26.  Hepatitis C test.  Hepatitis B test. Screening  Diabetes screening. This is done by checking your blood sugar (glucose) after you have not eaten for a while (fasting).  Sexually transmitted disease (STD) testing.  BRCA-related cancer screening. This may be done if you have a family history of breast, ovarian, tubal, or peritoneal cancers.  Pelvic exam and Pap test. This may be  done every 3 years starting at age 68. Starting at age 75, this may be done every 5 years if you have a Pap test in combination with an HPV test. Talk with your health care provider about your test results, treatment options, and if necessary, the need for more tests. Follow these instructions at home: Eating and  drinking   Eat a diet that includes fresh fruits and vegetables, whole grains, lean protein, and low-fat dairy.  Take vitamin and mineral supplements as recommended by your health care provider.  Do not drink alcohol if: ? Your health care provider tells you not to drink. ? You are pregnant, may be pregnant, or are planning to become pregnant.  If you drink alcohol: ? Limit how much you have to 0-1 drink a day. ? Be aware of how much alcohol is in your drink. In the [place] one drink equals one 12 oz bottle of beer (355 mL), one 5 oz glass of wine (148 mL), or one 1 oz glass of hard liquor (44 mL). Lifestyle  Take daily care of your teeth and gums.  Stay active. Exercise for at least 30 minutes on 5 or more days each week.  Do not use any products that contain nicotine or tobacco, such as cigarettes, e-cigarettes, and chewing tobacco. If you need help quitting, ask your health care provider.  If you are sexually active, practice safe sex. Use a condom or other form of birth control (contraception) in order to prevent pregnancy and STIs (sexually transmitted infections). If you plan to become pregnant, see your health care provider for a preconception visit. What's next?  Visit your health care provider once a year for a well check visit.  Ask your health care provider how often you should have your eyes and teeth checked.  Stay up to date on all vaccines. This information is not intended to replace advice given to you by your health care provider. Make sure you discuss any questions you have with your health care provider. Document Revised: 06/28/2018 Document Reviewed: 06/28/2018 Elsevier Patient Education  2020 [employer].

## 2020-06-10 NOTE — Addendum Note (Signed)
Addended by: Lerry Liner on: 06/10/2020 09:07 AM   Modules accepted: Orders

## 2020-06-10 NOTE — Addendum Note (Signed)
Addended by: Rigoberto Repass. M on: 06/10/2020 09:07 AM ° ° Modules accepted: Orders ° °

## 2020-06-10 NOTE — Progress Notes (Signed)
Established Patient Office Visit     This visit occurred during the SARS-CoV-2 public health emergency.  Safety protocols were in place, including screening questions prior to the visit, additional usage of staff PPE, and extensive cleaning of exam room while observing appropriate contact time as indicated for disinfecting solutions.    CC/Reason for Visit: Annual preventive exam  HPI: Lauren Zuniga is a 39 y.o. female who is coming in today for the above mentioned reasons. Past Medical History is significant for: Vitamin D deficiency and iron deficiency anemia.  She completed her 12 weeks of high-dose vitamin D supplementation and is currently taking over-the-counter iron twice daily.  She has no acute issues today.  Blood pressure is noted to be elevated at 130/90.  She has routine dental care but no eye care, she does not exercise routinely.  Since I last saw her she got both of her Covid vaccines, she thinks she might have gotten her Tdap booster but is not sure.   Past Medical/Surgical History: No past medical history on file.  No past surgical history on file.  Social History:  reports that she has never smoked. She has never used smokeless tobacco. She reports that she does not drink alcohol and does not use drugs.  Allergies: No Known Allergies  Family History:  Family History  Problem Relation Age of Onset  . Cancer Paternal Grandmother      Current Outpatient Medications:  .  ferrous sulfate 325 (65 FE) MG EC tablet, Take 325 mg by mouth 3 (three) times daily with meals., Disp: , Rfl:  .  Multiple Vitamins-Calcium (ONE-A-DAY WOMENS FORMULA PO), Take by mouth., Disp: , Rfl:   Review of Systems:  Constitutional: Denies fever, chills, diaphoresis, appetite change and fatigue.  HEENT: Denies photophobia, eye pain, redness, hearing loss, ear pain, congestion, sore throat, rhinorrhea, sneezing, mouth sores, trouble swallowing, neck pain, neck stiffness and  tinnitus.   Respiratory: Denies SOB, DOE, cough, chest tightness,  and wheezing.   Cardiovascular: Denies chest pain, palpitations and leg swelling.  Gastrointestinal: Denies nausea, vomiting, abdominal pain, diarrhea, constipation, blood in stool and abdominal distention.  Genitourinary: Denies dysuria, urgency, frequency, hematuria, flank pain and difficulty urinating.  Endocrine: Denies: hot or cold intolerance, sweats, changes in hair or nails, polyuria, polydipsia. Musculoskeletal: Denies myalgias, back pain, joint swelling, arthralgias and gait problem.  Skin: Denies pallor, rash and wound.  Neurological: Denies dizziness, seizures, syncope, weakness, light-headedness, numbness and headaches.  Hematological: Denies adenopathy. Easy bruising, personal or family bleeding history  Psychiatric/Behavioral: Denies suicidal ideation, mood changes, confusion, nervousness, sleep disturbance and agitation    Physical Exam: Vitals:   06/10/20 0828  BP: (!) 130/94  Pulse: 72  Temp: 98.5 F (36.9 C)  TempSrc: Oral  SpO2: 98%  Weight: 197 lb 14.4 oz (89.8 kg)  Height: 5' 4"  (1.626 m)    Body mass index is 33.97 kg/m.   Constitutional: NAD, calm, comfortable Eyes: PERRL, lids and conjunctivae normal ENMT: Mucous membranes are moist.Tympanic membrane is pearly white, no erythema or bulging. Neck: normal, supple, no masses, no thyromegaly Respiratory: clear to auscultation bilaterally, no wheezing, no crackles. Normal respiratory effort. No accessory muscle use.  Cardiovascular: Regular rate and rhythm, no murmurs / rubs / gallops. No extremity edema. 2+ pedal pulses. No carotid bruits.  Abdomen: no tenderness, no masses palpated. No hepatosplenomegaly. Bowel sounds positive.  Musculoskeletal: no clubbing / cyanosis. No joint deformity upper and lower extremities. Good ROM, no contractures. Normal  muscle tone.  Skin: no rashes, lesions, ulcers. No induration Neurologic: CN 2-12 grossly  intact. Sensation intact, DTR normal. Strength 5/5 in all 4.  Psychiatric: Normal judgment and insight. Alert and oriented x 3. Normal mood.      Office Visit from 06/10/2020 in [facility]  PHQ-9 Total Score 2       Impression and Plan:  Encounter for preventive health examination -She has routine dental care but needs eye care. -She received her Covid vaccines, unclear if she has had Tdap or not, she will bring immunization records so we can reconcile at next visit. -Screening labs today. -Healthy lifestyle discussed in detail. -Commence routine colon cancer screening age 75 and breast cancer screening age 52. -Pap smear offered today but she has declined stating that she is currently on her cycle.  Consider doing next visit.  Vitamin D deficiency  - Plan: VITAMIN D 25 Hydroxy (Vit-D Deficiency, Fractures)  Iron deficiency anemia, unspecified iron deficiency anemia type  - Plan: CBC with Differential/Platelet, Anemia panel  Obesity (BMI 30.0-34.9) -Discussed healthy lifestyle, including increased physical activity and better food choices to promote weight loss.  Elevated BP without diagnosis of hypertension -She will do ambulatory blood pressure measurements and return in 8 weeks for follow-up.    Patient Instructions  -Nice seeing you today!!  -Lab work today; will notify you once results are available.  -Make sure you bring your immunization records when you return.  -Purchase a blood pressure cuff and measure your BP 2-3 times a week.  -Return in 8 weeks for blood pressure check.   Preventive Care 50-51 Years Old, Female Preventive care refers to visits with your health care provider and lifestyle choices that can promote health and wellness. This includes:  A yearly physical exam. This may also be called an annual well check.  Regular dental visits and eye exams.  Immunizations.  Screening for certain conditions.  Healthy lifestyle  choices, such as eating a healthy diet, getting regular exercise, not using drugs or products that contain nicotine and tobacco, and limiting alcohol use. What can I expect for my preventive care visit? Physical exam Your health care provider will check your:  Height and weight. This may be used to calculate body mass index (BMI), which tells if you are at a healthy weight.  Heart rate and blood pressure.  Skin for abnormal spots. Counseling Your health care provider may ask you questions about your:  Alcohol, tobacco, and drug use.  Emotional well-being.  Home and relationship well-being.  Sexual activity.  Eating habits.  Work and work Statistician.  Method of birth control.  Menstrual cycle.  Pregnancy history. What immunizations do I need?  Influenza (flu) vaccine  This is recommended every year. Tetanus, diphtheria, and pertussis (Tdap) vaccine  You may need a Td booster every 10 years. Varicella (chickenpox) vaccine  You may need this if you have not been vaccinated. Human papillomavirus (HPV) vaccine  If recommended by your health care provider, you may need three doses over 6 months. Measles, mumps, and rubella (MMR) vaccine  You may need at least one dose of MMR. You may also need a second dose. Meningococcal conjugate (MenACWY) vaccine  One dose is recommended if you are age 49-21 years and a first-year college student living in a residence hall, or if you have one of several medical conditions. You may also need additional booster doses. Pneumococcal conjugate (PCV13) vaccine  You may need this if you have certain  conditions and were not previously vaccinated. Pneumococcal polysaccharide (PPSV23) vaccine  You may need one or two doses if you smoke cigarettes or if you have certain conditions. Hepatitis A vaccine  You may need this if you have certain conditions or if you travel or work in places where you may be exposed to hepatitis A. Hepatitis B  vaccine  You may need this if you have certain conditions or if you travel or work in places where you may be exposed to hepatitis B. Haemophilus influenzae type b (Hib) vaccine  You may need this if you have certain conditions. You may receive vaccines as individual doses or as more than one vaccine together in one shot (combination vaccines). Talk with your health care provider about the risks and benefits of combination vaccines. What tests do I need?  Blood tests  Lipid and cholesterol levels. These may be checked every 5 years starting at age 44.  Hepatitis C test.  Hepatitis B test. Screening  Diabetes screening. This is done by checking your blood sugar (glucose) after you have not eaten for a while (fasting).  Sexually transmitted disease (STD) testing.  BRCA-related cancer screening. This may be done if you have a family history of breast, ovarian, tubal, or peritoneal cancers.  Pelvic exam and Pap test. This may be done every 3 years starting at age 76. Starting at age 72, this may be done every 5 years if you have a Pap test in combination with an HPV test. Talk with your health care provider about your test results, treatment options, and if necessary, the need for more tests. Follow these instructions at home: Eating and drinking   Eat a diet that includes fresh fruits and vegetables, whole grains, lean protein, and low-fat dairy.  Take vitamin and mineral supplements as recommended by your health care provider.  Do not drink alcohol if: ? Your health care provider tells you not to drink. ? You are pregnant, may be pregnant, or are planning to become pregnant.  If you drink alcohol: ? Limit how much you have to 0-1 drink a day. ? Be aware of how much alcohol is in your drink. In the [place] one drink equals one 12 oz bottle of beer (355 mL), one 5 oz glass of wine (148 mL), or one 1 oz glass of hard liquor (44 mL). Lifestyle  Take daily care of your teeth and  gums.  Stay active. Exercise for at least 30 minutes on 5 or more days each week.  Do not use any products that contain nicotine or tobacco, such as cigarettes, e-cigarettes, and chewing tobacco. If you need help quitting, ask your health care provider.  If you are sexually active, practice safe sex. Use a condom or other form of birth control (contraception) in order to prevent pregnancy and STIs (sexually transmitted infections). If you plan to become pregnant, see your health care provider for a preconception visit. What's next?  Visit your health care provider once a year for a well check visit.  Ask your health care provider how often you should have your eyes and teeth checked.  Stay up to date on all vaccines. This information is not intended to replace advice given to you by your health care provider. Make sure you discuss any questions you have with your health care provider. Document Revised: 06/28/2018 Document Reviewed: 06/28/2018 Elsevier Patient Education  2020 [place], MD [facility]

## 2020-06-11 ENCOUNTER — Other Ambulatory Visit: Payer: Self-pay | Admitting: Internal Medicine

## 2020-06-11 DIAGNOSIS — E559 Vitamin D deficiency, unspecified: Secondary | ICD-10-CM

## 2020-06-11 LAB — CBC WITH DIFFERENTIAL/PLATELET
Absolute Monocytes: 363 {cells}/uL (ref 200–950)
Basophils Absolute: 18 {cells}/uL (ref 0–200)
Basophils Relative: 0.4 %
Eosinophils Absolute: 69 {cells}/uL (ref 15–500)
Eosinophils Relative: 1.5 %
HCT: 37.2 % (ref 35.0–45.0)
Hemoglobin: 12.3 g/dL (ref 11.7–15.5)
Lymphs Abs: 2378 {cells}/uL (ref 850–3900)
MCH: 26.7 pg — ABNORMAL LOW (ref 27.0–33.0)
MCHC: 33.1 g/dL (ref 32.0–36.0)
MCV: 80.9 fL (ref 80.0–100.0)
MPV: 10.7 fL (ref 7.5–12.5)
Monocytes Relative: 7.9 %
Neutro Abs: 1771 {cells}/uL (ref 1500–7800)
Neutrophils Relative %: 38.5 %
Platelets: 247 Thousand/uL (ref 140–400)
RBC: 4.6 Million/uL (ref 3.80–5.10)
RDW: 14.6 % (ref 11.0–15.0)
Total Lymphocyte: 51.7 %
WBC: 4.6 Thousand/uL (ref 3.8–10.8)

## 2020-06-11 LAB — VITAMIN D 25 HYDROXY (VIT D DEFICIENCY, FRACTURES): Vit D, 25-Hydroxy: 23 ng/mL — ABNORMAL LOW (ref 30–100)

## 2020-06-11 LAB — IRON,TIBC AND FERRITIN PANEL
%SAT: 21 % (ref 16–45)
Ferritin: 25 ng/mL (ref 16–154)
Iron: 63 ug/dL (ref 40–190)
TIBC: 303 ug/dL (ref 250–450)

## 2020-06-11 MED ORDER — VITAMIN D (ERGOCALCIFEROL) 1.25 MG (50000 UNIT) PO CAPS: 50000.0000 [IU] | ORAL_CAPSULE | ORAL | 0 refills | Status: DC

## 2020-08-05 ENCOUNTER — Other Ambulatory Visit: Payer: Self-pay

## 2020-08-05 ENCOUNTER — Encounter: Payer: Self-pay | Admitting: Internal Medicine

## 2020-08-05 ENCOUNTER — Ambulatory Visit (INDEPENDENT_AMBULATORY_CARE_PROVIDER_SITE_OTHER): Payer: 59 | Admitting: Internal Medicine

## 2020-08-05 VITALS — BP 120/80 | HR 65 | Temp 98.5°F | Wt 194.7 lb

## 2020-08-05 DIAGNOSIS — R030 Elevated blood-pressure reading, without diagnosis of hypertension: Secondary | ICD-10-CM | POA: Diagnosis not present

## 2020-08-05 DIAGNOSIS — E559 Vitamin D deficiency, unspecified: Secondary | ICD-10-CM

## 2020-08-05 DIAGNOSIS — Z23 Encounter for immunization: Secondary | ICD-10-CM | POA: Diagnosis not present

## 2020-08-05 MED ORDER — VITAMIN D (ERGOCALCIFEROL) 1.25 MG (50000 UNIT) PO CAPS: 50000.0000 [IU] | ORAL_CAPSULE | ORAL | 0 refills | Status: AC

## 2020-08-05 NOTE — Addendum Note (Signed)
Addended by: Kern Reap B on: 08/05/2020 05:43 PM   Modules accepted: Orders

## 2020-08-05 NOTE — Progress Notes (Signed)
Established Patient Office Visit     This visit occurred during the SARS-CoV-2 public health emergency.  Safety protocols were in place, including screening questions prior to the visit, additional usage of staff PPE, and extensive cleaning of exam room while observing appropriate contact time as indicated for disinfecting solutions.    CC/Reason for Visit: Blood pressure follow-up  HPI: Lauren Zuniga is a 39 y.o. female who is coming in today for the above mentioned reasons.  She was last seen in April as an inpatient.  At that visit she was noted to have elevated blood pressure.  She was asked to do ambulatory blood pressure monitoring and return today for follow-up.  She has a wrist cuff at home with measurements as follows:  134/93 160/69 149/104 128/78 131/74 154/66 139/60  Despite this , blood pressures in office today have been normal at 120/80 , 115/75.   Past Medical/Surgical History: No past medical history on file.  No past surgical history on file.  Social History:  reports that she has never smoked. She has never used smokeless tobacco. She reports that she does not drink alcohol and does not use drugs.  Allergies: No Known Allergies  Family History:  Family History  Problem Relation Age of Onset  . Cancer Paternal Grandmother      Current Outpatient Medications:  .  ferrous sulfate 325 (65 FE) MG EC tablet, Take 325 mg by mouth 3 (three) times daily with meals., Disp: , Rfl:  .  Multiple Vitamins-Calcium (ONE-A-DAY WOMENS FORMULA PO), Take by mouth., Disp: , Rfl:  .  Vitamin D, Ergocalciferol, (DRISDOL) 1.25 MG (50000 UNIT) CAPS capsule, Take 1 capsule (50,000 Units total) by mouth every 7 (seven) days for 12 doses., Disp: 12 capsule, Rfl: 0  Review of Systems:  Constitutional: Denies fever, chills, diaphoresis, appetite change and fatigue.  HEENT: Denies photophobia, eye pain, redness, hearing loss, ear pain, congestion, sore throat,  rhinorrhea, sneezing, mouth sores, trouble swallowing, neck pain, neck stiffness and tinnitus.   Respiratory: Denies SOB, DOE, cough, chest tightness,  and wheezing.   Cardiovascular: Denies chest pain, palpitations and leg swelling.  Gastrointestinal: Denies nausea, vomiting, abdominal pain, diarrhea, constipation, blood in stool and abdominal distention.  Genitourinary: Denies dysuria, urgency, frequency, hematuria, flank pain and difficulty urinating.  Endocrine: Denies: hot or cold intolerance, sweats, changes in hair or nails, polyuria, polydipsia. Musculoskeletal: Denies myalgias, back pain, joint swelling, arthralgias and gait problem.  Skin: Denies pallor, rash and wound.  Neurological: Denies dizziness, seizures, syncope, weakness, light-headedness, numbness and headaches.  Hematological: Denies adenopathy. Easy bruising, personal or family bleeding history  Psychiatric/Behavioral: Denies suicidal ideation, mood changes, confusion, nervousness, sleep disturbance and agitation    Physical Exam: Vitals:   08/05/20 0824  BP: 120/80  Pulse: 65  Temp: 98.5 F (36.9 C)  TempSrc: Oral  SpO2: 99%  Weight: 194 lb 11.2 oz (88.3 kg)    Body mass index is 33.42 kg/m.   Constitutional: NAD, calm, comfortable Eyes: PERRL, lids and conjunctivae normal ENMT: Mucous membranes are moist.  Respiratory: clear to auscultation bilaterally, no wheezing, no crackles. Normal respiratory effort. No accessory muscle use.  Cardiovascular: Regular rate and rhythm, no murmurs / rubs / gallops. No extremity edema.  Psychiatric: Normal judgment and insight. Alert and oriented x 3. Normal mood.    Impression and Plan:  Elevated BP without diagnosis of hypertension -Despite elevated pressures at home, and 2 blood pressures in office are within range. -No medications  for now, continue to monitor.  Vitamin D deficiency  - Plan: Vitamin D, Ergocalciferol, (DRISDOL) 1.25 MG (50000 UNIT) CAPS  capsule -She will return in 3 months for repeat vitamin D levels.    Patient Instructions  -Nice seeing you today!!  -Blood pressure looks good today.  -See you back in 1 year for your physical. Please come in fasting that day.     Chaya Jan, MD [facility]

## 2020-08-05 NOTE — Patient Instructions (Signed)
-  Nice seeing you today!!  -Blood pressure looks good today.  -See you back in 1 year for your physical. Please come in fasting that day.

## 2020-09-30 DIAGNOSIS — E669 Obesity, unspecified: Secondary | ICD-10-CM | POA: Diagnosis not present

## 2020-09-30 DIAGNOSIS — Z113 Encounter for screening for infections with a predominantly sexual mode of transmission: Secondary | ICD-10-CM | POA: Diagnosis not present

## 2020-09-30 DIAGNOSIS — N760 Acute vaginitis: Secondary | ICD-10-CM | POA: Diagnosis not present

## 2020-09-30 DIAGNOSIS — Z30431 Encounter for routine checking of intrauterine contraceptive device: Secondary | ICD-10-CM | POA: Diagnosis not present

## 2020-09-30 DIAGNOSIS — Z01419 Encounter for gynecological examination (general) (routine) without abnormal findings: Secondary | ICD-10-CM | POA: Diagnosis not present

## 2020-11-13 DIAGNOSIS — Z113 Encounter for screening for infections with a predominantly sexual mode of transmission: Secondary | ICD-10-CM | POA: Diagnosis not present

## 2020-11-13 DIAGNOSIS — Z30431 Encounter for routine checking of intrauterine contraceptive device: Secondary | ICD-10-CM | POA: Diagnosis not present

## 2020-11-18 ENCOUNTER — Other Ambulatory Visit: Payer: Self-pay | Admitting: Internal Medicine

## 2020-11-18 DIAGNOSIS — E559 Vitamin D deficiency, unspecified: Secondary | ICD-10-CM

## 2020-11-19 NOTE — Telephone Encounter (Signed)
Last OV 08/05/20 Last Vitamin D check 06/10/20: 1. Vit D def: 28413 IU weekly x 12 weeks with follow up levels then. Next OV 12/03/20  Pt due for repeat labs, refill not appropriate.

## 2020-12-03 ENCOUNTER — Ambulatory Visit: Payer: 59 | Admitting: Internal Medicine

## 2020-12-16 ENCOUNTER — Ambulatory Visit (INDEPENDENT_AMBULATORY_CARE_PROVIDER_SITE_OTHER): Payer: 59 | Admitting: Internal Medicine

## 2020-12-16 ENCOUNTER — Encounter: Payer: Self-pay | Admitting: Internal Medicine

## 2020-12-16 ENCOUNTER — Other Ambulatory Visit: Payer: Self-pay

## 2020-12-16 VITALS — BP 120/80 | HR 74 | Temp 98.4°F | Wt 198.7 lb

## 2020-12-16 DIAGNOSIS — B373 Candidiasis of vulva and vagina: Secondary | ICD-10-CM | POA: Diagnosis not present

## 2020-12-16 DIAGNOSIS — N898 Other specified noninflammatory disorders of vagina: Secondary | ICD-10-CM

## 2020-12-16 DIAGNOSIS — R3589 Other polyuria: Secondary | ICD-10-CM | POA: Diagnosis not present

## 2020-12-16 DIAGNOSIS — R300 Dysuria: Secondary | ICD-10-CM | POA: Diagnosis not present

## 2020-12-16 DIAGNOSIS — B3731 Acute candidiasis of vulva and vagina: Secondary | ICD-10-CM

## 2020-12-16 DIAGNOSIS — Z1231 Encounter for screening mammogram for malignant neoplasm of breast: Secondary | ICD-10-CM

## 2020-12-16 LAB — POCT URINALYSIS DIPSTICK
Bilirubin, UA: NEGATIVE
Blood, UA: NEGATIVE
Glucose, UA: NEGATIVE
Ketones, UA: NEGATIVE
Leukocytes, UA: NEGATIVE
Nitrite, UA: NEGATIVE
Protein, UA: NEGATIVE
Spec Grav, UA: 1.02 (ref 1.010–1.025)
Urobilinogen, UA: 0.2 E.U./dL
pH, UA: 6.5 (ref 5.0–8.0)

## 2020-12-16 LAB — POCT GLYCOSYLATED HEMOGLOBIN (HGB A1C): Hemoglobin A1C: 5.2 % (ref 4.0–5.6)

## 2020-12-16 MED ORDER — FLUCONAZOLE 150 MG PO TABS: 150.0000 mg | ORAL_TABLET | Freq: Once | ORAL | 0 refills | Status: AC

## 2020-12-16 NOTE — Patient Instructions (Signed)
-  Nice seeing you today!!  -No diabetes and no urine infection.  -Take fluconazole 150 mg once for yeast.

## 2020-12-16 NOTE — Progress Notes (Signed)
Acute office Visit     This visit occurred during the SARS-CoV-2 public health emergency.  Safety protocols were in place, including screening questions prior to the visit, additional usage of staff PPE, and extensive cleaning of exam room while observing appropriate contact time as indicated for disinfecting solutions.    CC/Reason for Visit: Dysuria, polyuria  HPI: Lauren Zuniga is a 40 y.o. female who is coming in today for the above mentioned reasons. Past Medical History is significant for: Vitamin D deficiency and iron deficiency anemia.  She has completed vitamin D supplementation, she takes ferrous sulfate once a day.  She has noticed for the past few weeks dysuria and polyuria.  She is concerned that she might be a diabetic.  She has no family history of diabetes.  She has also noticed some mild suprapubic pain, no pain with sexual intercourse, she will occasionally notice a thick, white vaginal discharge.  No fevers, no diarrhea, no abdominal bloating.   Past Medical/Surgical History: No past medical history on file.  No past surgical history on file.  Social History:  reports that she has never smoked. She has never used smokeless tobacco. She reports that she does not drink alcohol and does not use drugs.  Allergies: No Known Allergies  Family History:  Family History  Problem Relation Age of Onset  . Cancer Paternal Grandmother      Current Outpatient Medications:  .  ferrous sulfate 325 (65 FE) MG EC tablet, Take 325 mg by mouth 3 (three) times daily with meals., Disp: , Rfl:  .  fluconazole (DIFLUCAN) 150 MG tablet, Take 1 tablet (150 mg total) by mouth once for 1 dose., Disp: 1 tablet, Rfl: 0 .  Multiple Vitamins-Calcium (ONE-A-DAY WOMENS FORMULA PO), Take by mouth., Disp: , Rfl:   Review of Systems:  Constitutional: Denies fever, chills, diaphoresis, appetite change. HEENT: Denies photophobia, eye pain, redness, hearing loss, ear pain, congestion,  sore throat, rhinorrhea, sneezing, mouth sores, trouble swallowing, neck pain, neck stiffness and tinnitus.   Respiratory: Denies SOB, DOE, cough, chest tightness,  and wheezing.   Cardiovascular: Denies chest pain, palpitations and leg swelling.  Gastrointestinal: Denies nausea, vomiting, abdominal pain, diarrhea, constipation, blood in stool and abdominal distention.  Genitourinary: Denies hematuria, flank pain and difficulty urinating.  Endocrine: Denies: hot or cold intolerance, sweats, changes in hair or nails, polyuria, polydipsia. Musculoskeletal: Denies myalgias, back pain, joint swelling, arthralgias and gait problem.  Skin: Denies pallor, rash and wound.  Neurological: Denies dizziness, seizures, syncope, weakness, light-headedness, numbness and headaches.  Hematological: Denies adenopathy. Easy bruising, personal or family bleeding history  Psychiatric/Behavioral: Denies suicidal ideation, mood changes, confusion, nervousness, sleep disturbance and agitation    Physical Exam: Vitals:   12/16/20 0736  BP: 120/80  Pulse: 74  Temp: 98.4 F (36.9 C)  TempSrc: Oral  SpO2: 97%  Weight: 198 lb 11.2 oz (90.1 kg)    Body mass index is 34.11 kg/m.   Constitutional: NAD, calm, comfortable Eyes: PERRL, lids and conjunctivae normal ENMT: Mucous membranes are moist.  Respiratory: clear to auscultation bilaterally, no wheezing, no crackles. Normal respiratory effort. No accessory muscle use.  Cardiovascular: Regular rate and rhythm, no murmurs / rubs / gallops. No extremity edema. .  Abdomen: no tenderness, no masses palpated. No hepatosplenomegaly. Bowel sounds positive.  Neurologic: Grossly intact and nonfocal Psychiatric: Normal judgment and insight. Alert and oriented x 3. Normal mood.    Impression and Plan:  Polyuria  Dysuria  Vaginal  discharge Vaginal yeast infection  -A1c is 5.2 today, not likely diabetic. -Urine dipstick is negative for nitrates, leukocytes,  blood, protein. -Given thick white vaginal discharge suspect likely vaginal yeast.  I will prescribe fluconazole 150 mg to take once. -She is instructed to contact me if she does not improve.   Patient Instructions  -Nice seeing you today!!  -No diabetes and no urine infection.  -Take fluconazole 150 mg once for yeast.     Chaya Jan, MD [facility] at [facility]

## 2020-12-16 NOTE — Addendum Note (Signed)
Addended by: Kern Reap B on: 12/16/2020 08:16 AM   Modules accepted: Orders

## 2021-01-20 DIAGNOSIS — Z30431 Encounter for routine checking of intrauterine contraceptive device: Secondary | ICD-10-CM | POA: Diagnosis not present

## 2021-01-28 ENCOUNTER — Ambulatory Visit (INDEPENDENT_AMBULATORY_CARE_PROVIDER_SITE_OTHER): Payer: 59 | Admitting: Internal Medicine

## 2021-01-28 ENCOUNTER — Encounter: Payer: Self-pay | Admitting: Internal Medicine

## 2021-01-28 ENCOUNTER — Other Ambulatory Visit: Payer: Self-pay

## 2021-01-28 DIAGNOSIS — E559 Vitamin D deficiency, unspecified: Secondary | ICD-10-CM

## 2021-01-28 DIAGNOSIS — I10 Essential (primary) hypertension: Secondary | ICD-10-CM | POA: Diagnosis not present

## 2021-01-28 LAB — CBC WITH DIFFERENTIAL/PLATELET
Basophils Absolute: 0 K/uL (ref 0.0–0.1)
Basophils Relative: 0.6 % (ref 0.0–3.0)
Eosinophils Absolute: 0.1 K/uL (ref 0.0–0.7)
Eosinophils Relative: 1.4 % (ref 0.0–5.0)
HCT: 38 % (ref 36.0–46.0)
Hemoglobin: 12.6 g/dL (ref 12.0–15.0)
Lymphocytes Relative: 47.4 % — ABNORMAL HIGH (ref 12.0–46.0)
Lymphs Abs: 2.5 K/uL (ref 0.7–4.0)
MCHC: 33.1 g/dL (ref 30.0–36.0)
MCV: 83.5 fl (ref 78.0–100.0)
Monocytes Absolute: 0.4 K/uL (ref 0.1–1.0)
Monocytes Relative: 8.1 % (ref 3.0–12.0)
Neutro Abs: 2.2 K/uL (ref 1.4–7.7)
Neutrophils Relative %: 42.5 % — ABNORMAL LOW (ref 43.0–77.0)
Platelets: 225 K/uL (ref 150.0–400.0)
RBC: 4.55 Mil/uL (ref 3.87–5.11)
RDW: 14.2 % (ref 11.5–15.5)
WBC: 5.2 K/uL (ref 4.0–10.5)

## 2021-01-28 LAB — COMPREHENSIVE METABOLIC PANEL WITH GFR
ALT: 12 U/L (ref 0–35)
AST: 16 U/L (ref 0–37)
Albumin: 4.1 g/dL (ref 3.5–5.2)
Alkaline Phosphatase: 39 U/L (ref 39–117)
BUN: 7 mg/dL (ref 6–23)
CO2: 28 meq/L (ref 19–32)
Calcium: 9.1 mg/dL (ref 8.4–10.5)
Chloride: 104 meq/L (ref 96–112)
Creatinine, Ser: 0.7 mg/dL (ref 0.40–1.20)
GFR: 108.05 mL/min
Glucose, Bld: 83 mg/dL (ref 70–99)
Potassium: 4.2 meq/L (ref 3.5–5.1)
Sodium: 139 meq/L (ref 135–145)
Total Bilirubin: 0.9 mg/dL (ref 0.2–1.2)
Total Protein: 7.2 g/dL (ref 6.0–8.3)

## 2021-01-28 LAB — LIPID PANEL
Cholesterol: 143 mg/dL (ref 0–200)
HDL: 51 mg/dL
LDL Cholesterol: 83 mg/dL (ref 0–99)
NonHDL: 92.02
Total CHOL/HDL Ratio: 3
Triglycerides: 47 mg/dL (ref 0.0–149.0)
VLDL: 9.4 mg/dL (ref 0.0–40.0)

## 2021-01-28 LAB — VITAMIN D 25 HYDROXY (VIT D DEFICIENCY, FRACTURES): VITD: 31.57 ng/mL (ref 30.00–100.00)

## 2021-01-28 MED ORDER — HYDROCHLOROTHIAZIDE 25 MG PO TABS: 25.0000 mg | ORAL_TABLET | Freq: Every day | ORAL | 1 refills | Status: DC

## 2021-01-28 NOTE — Patient Instructions (Signed)
-Nice seeing you today!!  -Lab work today; will notify you once results are available.  -Start HCTZ 25 mg daily.  -Check blood pressure daily and bring measurements in to your next appointment.  -Schedule follow up in 8 weeks.   [email or web address]">  DASH Eating Plan DASH stands for Dietary Approaches to Stop Hypertension. The DASH eating plan is a healthy eating plan that has been shown to:  Reduce high blood pressure (hypertension).  Reduce your risk for type 2 diabetes, heart disease, and stroke.  Help with weight loss. What are tips for following this plan? Reading food labels  Check food labels for the amount of salt (sodium) per serving. Choose foods with less than 5 percent of the Daily Value of sodium. Generally, foods with less than 300 milligrams (mg) of sodium per serving fit into this eating plan.  To find whole grains, look for the word "whole" as the first word in the ingredient list. Shopping  Buy products labeled as "low-sodium" or "no salt added."  Buy fresh foods. Avoid canned foods and pre-made or frozen meals. Cooking  Avoid adding salt when cooking. Use salt-free seasonings or herbs instead of table salt or sea salt. Check with your health care provider or pharmacist before using salt substitutes.  Do not fry foods. Cook foods using healthy methods such as baking, boiling, grilling, roasting, and broiling instead.  Cook with heart-healthy oils, such as olive, canola, avocado, soybean, or sunflower oil. Meal planning  Eat a balanced diet that includes: ? 4 or more servings of fruits and 4 or more servings of vegetables each day. Try to fill one-half of your plate with fruits and vegetables. ? 6-8 servings of whole grains each day. ? Less than 6 oz (170 g) of lean meat, poultry, or fish each day. A 3-oz (85-g) serving of meat is about the same size as a deck of cards. One egg equals 1 oz (28 g). ? 2-3  servings of low-fat dairy each day. One serving is 1 cup (237 mL). ? 1 serving of nuts, seeds, or beans 5 times each week. ? 2-3 servings of heart-healthy fats. Healthy fats called omega-3 fatty acids are found in foods such as walnuts, flaxseeds, fortified milks, and eggs. These fats are also found in cold-water fish, such as sardines, salmon, and mackerel.  Limit how much you eat of: ? Canned or prepackaged foods. ? Food that is high in trans fat, such as some fried foods. ? Food that is high in saturated fat, such as fatty meat. ? Desserts and other sweets, sugary drinks, and other foods with added sugar. ? Full-fat dairy products.  Do not salt foods before eating.  Do not eat more than 4 egg yolks a week.  Try to eat at least 2 vegetarian meals a week.  Eat more home-cooked food and less restaurant, buffet, and fast food.   Lifestyle  When eating at a restaurant, ask that your food be prepared with less salt or no salt, if possible.  If you drink alcohol: ? Limit how much you use to:  0-1 drink a day for women who are not pregnant.  0-2 drinks a day for men. ? Be aware of how much alcohol is in your drink. In the [place] one drink equals one 12 oz bottle of beer (355 mL), one 5 oz glass of wine (148 mL), or one 1 oz glass of hard liquor (44 mL). General information  Avoid eating more than 2,300 mg of salt  a day. If you have hypertension, you may need to reduce your sodium intake to 1,500 mg a day.  Work with your health care provider to maintain a healthy body weight or to lose weight. Ask what an ideal weight is for you.  Get at least 30 minutes of exercise that causes your heart to beat faster (aerobic exercise) most days of the week. Activities may include walking, swimming, or biking.  Work with your health care provider or dietitian to adjust your eating plan to your individual calorie needs. What foods should I eat? Fruits All fresh, dried, or frozen fruit. Canned  fruit in natural juice (without added sugar). Vegetables Fresh or frozen vegetables (raw, steamed, roasted, or grilled). Low-sodium or reduced-sodium tomato and vegetable juice. Low-sodium or reduced-sodium tomato sauce and tomato paste. Low-sodium or reduced-sodium canned vegetables. Grains Whole-grain or whole-wheat bread. Whole-grain or whole-wheat pasta. Brown rice. Orpah Cobb. Bulgur. Whole-grain and low-sodium cereals. Pita bread. Low-fat, low-sodium crackers. Whole-wheat flour tortillas. Meats and other proteins Skinless chicken or [place]. Ground chicken or [place]. Pork with fat trimmed off. Fish and seafood. Egg whites. Dried beans, peas, or lentils. Unsalted nuts, nut butters, and seeds. Unsalted canned beans. Lean cuts of beef with fat trimmed off. Low-sodium, lean precooked or cured meat, such as sausages or meat loaves. Dairy Low-fat (1%) or fat-free (skim) milk. Reduced-fat, low-fat, or fat-free cheeses. Nonfat, low-sodium ricotta or cottage cheese. Low-fat or nonfat yogurt. Low-fat, low-sodium cheese. Fats and oils Soft margarine without trans fats. Vegetable oil. Reduced-fat, low-fat, or light mayonnaise and salad dressings (reduced-sodium). Canola, safflower, olive, avocado, soybean, and sunflower oils. Avocado. Seasonings and condiments Herbs. Spices. Seasoning mixes without salt. Other foods Unsalted popcorn and pretzels. Fat-free sweets. The items listed above may not be a complete list of foods and beverages you can eat. Contact a dietitian for more information. What foods should I avoid? Fruits Canned fruit in a light or heavy syrup. Fried fruit. Fruit in cream or butter sauce. Vegetables Creamed or fried vegetables. Vegetables in a cheese sauce. Regular canned vegetables (not low-sodium or reduced-sodium). Regular canned tomato sauce and paste (not low-sodium or reduced-sodium). Regular tomato and vegetable juice (not low-sodium or reduced-sodium). Rosita Fire.  Olives. Grains Baked goods made with fat, such as croissants, muffins, or some breads. Dry pasta or rice meal packs. Meats and other proteins Fatty cuts of meat. Ribs. Fried meat. Tomasa Blase. Bologna, salami, and other precooked or cured meats, such as sausages or meat loaves. Fat from the back of a pig (fatback). Bratwurst. Salted nuts and seeds. Canned beans with added salt. Canned or smoked fish. Whole eggs or egg yolks. Chicken or [place] with skin. Dairy Whole or 2% milk, cream, and half-and-half. Whole or full-fat cream cheese. Whole-fat or sweetened yogurt. Full-fat cheese. Nondairy creamers. Whipped toppings. Processed cheese and cheese spreads. Fats and oils Butter. Stick margarine. Lard. Shortening. Ghee. Bacon fat. Tropical oils, such as coconut, palm kernel, or palm oil. Seasonings and condiments Onion salt, garlic salt, seasoned salt, table salt, and sea salt. Worcestershire sauce. Tartar sauce. Barbecue sauce. Teriyaki sauce. Soy sauce, including reduced-sodium. Steak sauce. Canned and packaged gravies. Fish sauce. Oyster sauce. Cocktail sauce. Store-bought horseradish. Ketchup. Mustard. Meat flavorings and tenderizers. Bouillon cubes. Hot sauces. Pre-made or packaged marinades. Pre-made or packaged taco seasonings. Relishes. Regular salad dressings. Other foods Salted popcorn and pretzels. The items listed above may not be a complete list of foods and beverages you should avoid. Contact a dietitian for more information. Where to find  more information  National Heart, Lung, and Blood Institute: https://wilson-eaton.com/  American Heart Association: www.heart.org  Academy of Nutrition and Dietetics: www.eatright.[place]: www.kidney.org Summary  The DASH eating plan is a healthy eating plan that has been shown to reduce high blood pressure (hypertension). It may also reduce your risk for type 2 diabetes, heart disease, and stroke.  When on the DASH eating plan, aim to  eat more fresh fruits and vegetables, whole grains, lean proteins, low-fat dairy, and heart-healthy fats.  With the DASH eating plan, you should limit salt (sodium) intake to 2,300 mg a day. If you have hypertension, you may need to reduce your sodium intake to 1,500 mg a day.  Work with your health care provider or dietitian to adjust your eating plan to your individual calorie needs. This information is not intended to replace advice given to you by your health care provider. Make sure you discuss any questions you have with your health care provider. Document Revised: 09/20/2019 Document Reviewed: 09/20/2019 Elsevier Patient Education  2021 [employer].

## 2021-01-28 NOTE — Addendum Note (Signed)
Addended by: Bonnye Fava on: 01/28/2021 09:29 AM   Modules accepted: Orders

## 2021-01-28 NOTE — Progress Notes (Signed)
Established Patient Office Visit     This visit occurred during the SARS-CoV-2 public health emergency.  Safety protocols were in place, including screening questions prior to the visit, additional usage of staff PPE, and extensive cleaning of exam room while observing appropriate contact time as indicated for disinfecting solutions.    CC/Reason for Visit: Elevated blood pressure  HPI: Lauren Zuniga is a 40 y.o. female who is coming in today for the above mentioned reasons.  She has been noticing some elevated blood pressure measurements at home for some time now.  She did not bring any charts with her but says that the top number will be anywhere from 1 25-1 50 does not recall the bottom number.  2 measurements in office today are 140/90 and 145/90.  She denies headache, chest pain, shortness of breath, lower extremity edema, focal neurologic deficit.  Past Medical/Surgical History: No past medical history on file.  No past surgical history on file.  Social History:  reports that she has never smoked. She has never used smokeless tobacco. She reports that she does not drink alcohol and does not use drugs.  Allergies: No Known Allergies  Family History:  Family History  Problem Relation Age of Onset  . Cancer Paternal Grandmother      Current Outpatient Medications:  .  ferrous sulfate 325 (65 FE) MG EC tablet, Take 325 mg by mouth 3 (three) times daily with meals., Disp: , Rfl:  .  hydrochlorothiazide (HYDRODIURIL) 25 MG tablet, Take 1 tablet (25 mg total) by mouth daily., Disp: 90 tablet, Rfl: 1 .  Multiple Vitamins-Calcium (ONE-A-DAY WOMENS FORMULA PO), Take by mouth., Disp: , Rfl:   Review of Systems:  Constitutional: Denies fever, chills, diaphoresis, appetite change and fatigue.  HEENT: Denies photophobia, eye pain, redness, hearing loss, ear pain, congestion, sore throat, rhinorrhea, sneezing, mouth sores, trouble swallowing, neck pain, neck stiffness and  tinnitus.   Respiratory: Denies SOB, DOE, cough, chest tightness,  and wheezing.   Cardiovascular: Denies chest pain, palpitations and leg swelling.  Gastrointestinal: Denies nausea, vomiting, abdominal pain, diarrhea, constipation, blood in stool and abdominal distention.  Genitourinary: Denies dysuria, urgency, frequency, hematuria, flank pain and difficulty urinating.  Endocrine: Denies: hot or cold intolerance, sweats, changes in hair or nails, polyuria, polydipsia. Musculoskeletal: Denies myalgias, back pain, joint swelling, arthralgias and gait problem.  Skin: Denies pallor, rash and wound.  Neurological: Denies dizziness, seizures, syncope, weakness, light-headedness, numbness and headaches.  Hematological: Denies adenopathy. Easy bruising, personal or family bleeding history  Psychiatric/Behavioral: Denies suicidal ideation, mood changes, confusion, nervousness, sleep disturbance and agitation    Physical Exam: Vitals:   01/28/21 0905  BP: 140/90  Pulse: 72  Temp: 98.8 F (37.1 C)  TempSrc: Oral  SpO2: 98%  Weight: 200 lb 4.8 oz (90.9 kg)    Body mass index is 34.38 kg/m.   Constitutional: NAD, calm, comfortable Eyes: PERRL, lids and conjunctivae normal ENMT: Mucous membranes are moist.  Respiratory: clear to auscultation bilaterally, no wheezing, no crackles. Normal respiratory effort. No accessory muscle use.  Cardiovascular: Regular rate and rhythm, no murmurs / rubs / gallops. No extremity edema. Neurologic grossly intact and nonfocal Psychiatric: Normal judgment and insight. Alert and oriented x 3. Normal mood.    Impression and Plan:  Primary hypertension  - Plan: CBC with Differential/Platelet, Comprehensive metabolic panel, Lipid panel, hydrochlorothiazide (HYDRODIURIL) 25 MG tablet -I will go ahead and make this diagnosis today based on previous office visits and current  in office measurements. -I will start her on hydrochlorothiazide 25 mg daily, check  basic labs today.  We have discussed low-sodium diet as well.   Patient Instructions   -Nice seeing you today!!  -Lab work today; will notify you once results are available.  -Start HCTZ 25 mg daily.  -Check blood pressure daily and bring measurements in to your next appointment.  -Schedule follow up in 8 weeks.   https://www.mata.com/https://www.nhlbi.nih.gov/files/docs/public/heart/dash_brief.pdf">  DASH Eating Plan DASH stands for Dietary Approaches to Stop Hypertension. The DASH eating plan is a healthy eating plan that has been shown to:  Reduce high blood pressure (hypertension).  Reduce your risk for type 2 diabetes, heart disease, and stroke.  Help with weight loss. What are tips for following this plan? Reading food labels  Check food labels for the amount of salt (sodium) per serving. Choose foods with less than 5 percent of the Daily Value of sodium. Generally, foods with less than 300 milligrams (mg) of sodium per serving fit into this eating plan.  To find whole grains, look for the word "whole" as the first word in the ingredient list. Shopping  Buy products labeled as "low-sodium" or "no salt added."  Buy fresh foods. Avoid canned foods and pre-made or frozen meals. Cooking  Avoid adding salt when cooking. Use salt-free seasonings or herbs instead of table salt or sea salt. Check with your health care provider or pharmacist before using salt substitutes.  Do not fry foods. Cook foods using healthy methods such as baking, boiling, grilling, roasting, and broiling instead.  Cook with heart-healthy oils, such as olive, canola, avocado, soybean, or sunflower oil. Meal planning  Eat a balanced diet that includes: ? 4 or more servings of fruits and 4 or more servings of vegetables each day. Try to fill one-half of your plate with fruits and vegetables. ? 6-8 servings of whole grains each day. ? Less than 6 oz (170 g) of lean meat, poultry, or fish each day. A 3-oz (85-g) serving  of meat is about the same size as a deck of cards. One egg equals 1 oz (28 g). ? 2-3 servings of low-fat dairy each day. One serving is 1 cup (237 mL). ? 1 serving of nuts, seeds, or beans 5 times each week. ? 2-3 servings of heart-healthy fats. Healthy fats called omega-3 fatty acids are found in foods such as walnuts, flaxseeds, fortified milks, and eggs. These fats are also found in cold-water fish, such as sardines, salmon, and mackerel.  Limit how much you eat of: ? Canned or prepackaged foods. ? Food that is high in trans fat, such as some fried foods. ? Food that is high in saturated fat, such as fatty meat. ? Desserts and other sweets, sugary drinks, and other foods with added sugar. ? Full-fat dairy products.  Do not salt foods before eating.  Do not eat more than 4 egg yolks a week.  Try to eat at least 2 vegetarian meals a week.  Eat more home-cooked food and less restaurant, buffet, and fast food.   Lifestyle  When eating at a restaurant, ask that your food be prepared with less salt or no salt, if possible.  If you drink alcohol: ? Limit how much you use to:  0-1 drink a day for women who are not pregnant.  0-2 drinks a day for men. ? Be aware of how much alcohol is in your drink. In the [place] one drink equals one 12 oz bottle of beer (  355 mL), one 5 oz glass of wine (148 mL), or one 1 oz glass of hard liquor (44 mL). General information  Avoid eating more than 2,300 mg of salt a day. If you have hypertension, you may need to reduce your sodium intake to 1,500 mg a day.  Work with your health care provider to maintain a healthy body weight or to lose weight. Ask what an ideal weight is for you.  Get at least 30 minutes of exercise that causes your heart to beat faster (aerobic exercise) most days of the week. Activities may include walking, swimming, or biking.  Work with your health care provider or dietitian to adjust your eating plan to your individual calorie  needs. What foods should I eat? Fruits All fresh, dried, or frozen fruit. Canned fruit in natural juice (without added sugar). Vegetables Fresh or frozen vegetables (raw, steamed, roasted, or grilled). Low-sodium or reduced-sodium tomato and vegetable juice. Low-sodium or reduced-sodium tomato sauce and tomato paste. Low-sodium or reduced-sodium canned vegetables. Grains Whole-grain or whole-wheat bread. Whole-grain or whole-wheat pasta. Brown rice. Orpah Cobb. Bulgur. Whole-grain and low-sodium cereals. Pita bread. Low-fat, low-sodium crackers. Whole-wheat flour tortillas. Meats and other proteins Skinless chicken or [place]. Ground chicken or [place]. Pork with fat trimmed off. Fish and seafood. Egg whites. Dried beans, peas, or lentils. Unsalted nuts, nut butters, and seeds. Unsalted canned beans. Lean cuts of beef with fat trimmed off. Low-sodium, lean precooked or cured meat, such as sausages or meat loaves. Dairy Low-fat (1%) or fat-free (skim) milk. Reduced-fat, low-fat, or fat-free cheeses. Nonfat, low-sodium ricotta or cottage cheese. Low-fat or nonfat yogurt. Low-fat, low-sodium cheese. Fats and oils Soft margarine without trans fats. Vegetable oil. Reduced-fat, low-fat, or light mayonnaise and salad dressings (reduced-sodium). Canola, safflower, olive, avocado, soybean, and sunflower oils. Avocado. Seasonings and condiments Herbs. Spices. Seasoning mixes without salt. Other foods Unsalted popcorn and pretzels. Fat-free sweets. The items listed above may not be a complete list of foods and beverages you can eat. Contact a dietitian for more information. What foods should I avoid? Fruits Canned fruit in a light or heavy syrup. Fried fruit. Fruit in cream or butter sauce. Vegetables Creamed or fried vegetables. Vegetables in a cheese sauce. Regular canned vegetables (not low-sodium or reduced-sodium). Regular canned tomato sauce and paste (not low-sodium or reduced-sodium). Regular  tomato and vegetable juice (not low-sodium or reduced-sodium). Rosita Fire. Olives. Grains Baked goods made with fat, such as croissants, muffins, or some breads. Dry pasta or rice meal packs. Meats and other proteins Fatty cuts of meat. Ribs. Fried meat. Tomasa Blase. Bologna, salami, and other precooked or cured meats, such as sausages or meat loaves. Fat from the back of a pig (fatback). Bratwurst. Salted nuts and seeds. Canned beans with added salt. Canned or smoked fish. Whole eggs or egg yolks. Chicken or [place] with skin. Dairy Whole or 2% milk, cream, and half-and-half. Whole or full-fat cream cheese. Whole-fat or sweetened yogurt. Full-fat cheese. Nondairy creamers. Whipped toppings. Processed cheese and cheese spreads. Fats and oils Butter. Stick margarine. Lard. Shortening. Ghee. Bacon fat. Tropical oils, such as coconut, palm kernel, or palm oil. Seasonings and condiments Onion salt, garlic salt, seasoned salt, table salt, and sea salt. Worcestershire sauce. Tartar sauce. Barbecue sauce. Teriyaki sauce. Soy sauce, including reduced-sodium. Steak sauce. Canned and packaged gravies. Fish sauce. Oyster sauce. Cocktail sauce. Store-bought horseradish. Ketchup. Mustard. Meat flavorings and tenderizers. Bouillon cubes. Hot sauces. Pre-made or packaged marinades. Pre-made or packaged taco seasonings. Relishes. Regular salad dressings. Other  foods Salted popcorn and pretzels. The items listed above may not be a complete list of foods and beverages you should avoid. Contact a dietitian for more information. Where to find more information  National Heart, Lung, and Blood Institute: PopSteam.is  American Heart Association: www.heart.org  Academy of Nutrition and Dietetics: www.eatright.org  National Kidney Foundation: www.kidney.org Summary  The DASH eating plan is a healthy eating plan that has been shown to reduce high blood pressure (hypertension). It may also reduce your risk for type 2  diabetes, heart disease, and stroke.  When on the DASH eating plan, aim to eat more fresh fruits and vegetables, whole grains, lean proteins, low-fat dairy, and heart-healthy fats.  With the DASH eating plan, you should limit salt (sodium) intake to 2,300 mg a day. If you have hypertension, you may need to reduce your sodium intake to 1,500 mg a day.  Work with your health care provider or dietitian to adjust your eating plan to your individual calorie needs. This information is not intended to replace advice given to you by your health care provider. Make sure you discuss any questions you have with your health care provider. Document Revised: 09/20/2019 Document Reviewed: 09/20/2019 Elsevier Patient Education  2021 Elsevier Inc.      Chaya Jan, MD [facility] at [facility]

## 2021-02-19 DIAGNOSIS — Z30431 Encounter for routine checking of intrauterine contraceptive device: Secondary | ICD-10-CM | POA: Diagnosis not present

## 2021-02-19 DIAGNOSIS — Z3009 Encounter for other general counseling and advice on contraception: Secondary | ICD-10-CM | POA: Diagnosis not present

## 2021-02-19 DIAGNOSIS — Z30432 Encounter for removal of intrauterine contraceptive device: Secondary | ICD-10-CM | POA: Diagnosis not present

## 2021-03-05 ENCOUNTER — Ambulatory Visit (INDEPENDENT_AMBULATORY_CARE_PROVIDER_SITE_OTHER): Payer: 59 | Admitting: Internal Medicine

## 2021-03-05 ENCOUNTER — Encounter: Payer: Self-pay | Admitting: Internal Medicine

## 2021-03-05 ENCOUNTER — Other Ambulatory Visit: Payer: Self-pay

## 2021-03-05 VITALS — BP 114/80 | HR 69 | Temp 98.2°F | Ht 64.0 in | Wt 200.3 lb

## 2021-03-05 DIAGNOSIS — I10 Essential (primary) hypertension: Secondary | ICD-10-CM

## 2021-03-05 NOTE — Progress Notes (Signed)
     Established Patient Office Visit     This visit occurred during the SARS-CoV-2 public health emergency.  Safety protocols were in place, including screening questions prior to the visit, additional usage of staff PPE, and extensive cleaning of exam room while observing appropriate contact time as indicated for disinfecting solutions.    CC/Reason for Visit: Follow-up blood pressure  HPI: Lauren Zuniga is a 40 y.o. female who is coming in today for the above mentioned reasons. Past Medical History is significant for: Hypertension.  At last visit she was started on hydrochlorothiazide 25 mg daily due to persistently elevated blood pressures.  She is here today to follow-up.  She has no acute complaints.  Has been tolerating medications well.   Past Medical/Surgical History: Past Medical History:  Diagnosis Date  . Hypertension     No past surgical history on file.  Social History:  reports that she has never smoked. She has never used smokeless tobacco. She reports that she does not drink alcohol and does not use drugs.  Allergies: No Known Allergies  Family History:  Family History  Problem Relation Age of Onset  . Cancer Paternal Grandmother      Current Outpatient Medications:  .  ferrous sulfate 325 (65 FE) MG EC tablet, Take 325 mg by mouth 3 (three) times daily with meals., Disp: , Rfl:  .  hydrochlorothiazide (HYDRODIURIL) 25 MG tablet, Take 1 tablet (25 mg total) by mouth daily., Disp: 90 tablet, Rfl: 1 .  Multiple Vitamins-Calcium (ONE-A-DAY WOMENS FORMULA PO), Take by mouth., Disp: , Rfl:   Review of Systems:  Constitutional: Denies fever, chills, diaphoresis, appetite change and fatigue.  HEENT: Denies photophobia, eye pain, redness, hearing loss, ear pain, congestion, sore throat, rhinorrhea, sneezing, mouth sores, trouble swallowing, neck pain, neck stiffness and tinnitus.   Respiratory: Denies SOB, DOE, cough, chest tightness,  and wheezing.    Cardiovascular: Denies chest pain, palpitations and leg swelling.  Gastrointestinal: Denies nausea, vomiting, abdominal pain, diarrhea, constipation, blood in stool and abdominal distention.  Genitourinary: Denies dysuria, urgency, frequency, hematuria, flank pain and difficulty urinating.  Endocrine: Denies: hot or cold intolerance, sweats, changes in hair or nails, polyuria, polydipsia. Musculoskeletal: Denies myalgias, back pain, joint swelling, arthralgias and gait problem.  Skin: Denies pallor, rash and wound.  Neurological: Denies dizziness, seizures, syncope, weakness, light-headedness, numbness and headaches.  Hematological: Denies adenopathy. Easy bruising, personal or family bleeding history  Psychiatric/Behavioral: Denies suicidal ideation, mood changes, confusion, nervousness, sleep disturbance and agitation    Physical Exam: Vitals:   03/05/21 0830  BP: 114/80  Pulse: 69  Temp: 98.2 F (36.8 C)  TempSrc: Oral  SpO2: 98%  Weight: 200 lb 4.8 oz (90.9 kg)  Height: 5\' 4"  (1.626 m)    Body mass index is 34.38 kg/m.   Constitutional: NAD, calm, comfortable Eyes: PERRL, lids and conjunctivae normal ENMT: Mucous membranes are moist.  Respiratory: clear to auscultation bilaterally, no wheezing, no crackles. Normal respiratory effort. No accessory muscle use.  Cardiovascular: Regular rate and rhythm, no murmurs / rubs / gallops. No extremity edema.  Neurologic: Grossly intact and nonfocal Psychiatric: Normal judgment and insight. Alert and oriented x 3. Normal mood.    Impression and Plan:  Primary hypertension -Excellent control on hydrochlorothiazide 25 mg daily. -She will return in 3 to 4 months for continued follow-up.    , MD [facility]

## 2021-04-05 ENCOUNTER — Other Ambulatory Visit: Payer: Self-pay

## 2021-04-05 ENCOUNTER — Ambulatory Visit
Admission: RE | Admit: 2021-04-05 | Discharge: 2021-04-05 | Disposition: A | Discharge status: Home / Self Care | Payer: 59 | Source: Ambulatory Visit | Attending: Internal Medicine | Admitting: Internal Medicine

## 2021-04-05 DIAGNOSIS — Z1231 Encounter for screening mammogram for malignant neoplasm of breast: Secondary | ICD-10-CM

## 2021-04-19 ENCOUNTER — Other Ambulatory Visit: Payer: Self-pay | Admitting: Internal Medicine

## 2021-04-19 DIAGNOSIS — E559 Vitamin D deficiency, unspecified: Secondary | ICD-10-CM

## 2021-05-21 DIAGNOSIS — Z3009 Encounter for other general counseling and advice on contraception: Secondary | ICD-10-CM | POA: Diagnosis not present

## 2021-05-21 DIAGNOSIS — I10 Essential (primary) hypertension: Secondary | ICD-10-CM | POA: Diagnosis not present

## 2021-06-02 DIAGNOSIS — Z30430 Encounter for insertion of intrauterine contraceptive device: Secondary | ICD-10-CM | POA: Diagnosis not present

## 2021-06-02 DIAGNOSIS — Z113 Encounter for screening for infections with a predominantly sexual mode of transmission: Secondary | ICD-10-CM | POA: Diagnosis not present

## 2021-06-02 DIAGNOSIS — Z30014 Encounter for initial prescription of intrauterine contraceptive device: Secondary | ICD-10-CM | POA: Diagnosis not present

## 2021-06-02 DIAGNOSIS — Z3202 Encounter for pregnancy test, result negative: Secondary | ICD-10-CM | POA: Diagnosis not present

## 2021-08-20 ENCOUNTER — Other Ambulatory Visit: Payer: Self-pay | Admitting: Internal Medicine

## 2021-08-20 DIAGNOSIS — E559 Vitamin D deficiency, unspecified: Secondary | ICD-10-CM

## 2021-09-15 ENCOUNTER — Encounter: Payer: Self-pay | Admitting: Internal Medicine

## 2021-09-15 ENCOUNTER — Ambulatory Visit (INDEPENDENT_AMBULATORY_CARE_PROVIDER_SITE_OTHER): Payer: 59 | Admitting: Internal Medicine

## 2021-09-15 VITALS — BP 110/80 | HR 75 | Temp 98.4°F | Wt 201.2 lb

## 2021-09-15 DIAGNOSIS — E559 Vitamin D deficiency, unspecified: Secondary | ICD-10-CM

## 2021-09-15 DIAGNOSIS — D509 Iron deficiency anemia, unspecified: Secondary | ICD-10-CM

## 2021-09-15 DIAGNOSIS — R5383 Other fatigue: Secondary | ICD-10-CM

## 2021-09-15 DIAGNOSIS — R002 Palpitations: Secondary | ICD-10-CM | POA: Diagnosis not present

## 2021-09-15 DIAGNOSIS — I10 Essential (primary) hypertension: Secondary | ICD-10-CM

## 2021-09-15 LAB — CBC WITH DIFFERENTIAL/PLATELET
Basophils Absolute: 0 10*3/uL (ref 0.0–0.1)
Basophils Relative: 0.5 % (ref 0.0–3.0)
Eosinophils Absolute: 0.1 10*3/uL (ref 0.0–0.7)
Eosinophils Relative: 1 % (ref 0.0–5.0)
HCT: 40 % (ref 36.0–46.0)
Hemoglobin: 13.2 g/dL (ref 12.0–15.0)
Lymphocytes Relative: 41.2 % (ref 12.0–46.0)
Lymphs Abs: 2.3 10*3/uL (ref 0.7–4.0)
MCHC: 33.1 g/dL (ref 30.0–36.0)
MCV: 81.9 fl (ref 78.0–100.0)
Monocytes Absolute: 0.4 10*3/uL (ref 0.1–1.0)
Monocytes Relative: 6.8 % (ref 3.0–12.0)
Neutro Abs: 2.9 10*3/uL (ref 1.4–7.7)
Neutrophils Relative %: 50.5 % (ref 43.0–77.0)
Platelets: 264 10*3/uL (ref 150.0–400.0)
RBC: 4.88 Mil/uL (ref 3.87–5.11)
RDW: 15.3 % (ref 11.5–15.5)
WBC: 5.7 10*3/uL (ref 4.0–10.5)

## 2021-09-15 LAB — TSH: TSH: 0.7 u[IU]/mL (ref 0.35–5.50)

## 2021-09-15 LAB — COMPREHENSIVE METABOLIC PANEL
ALT: 14 U/L (ref 0–35)
AST: 20 U/L (ref 0–37)
Albumin: 4.5 g/dL (ref 3.5–5.2)
Alkaline Phosphatase: 45 U/L (ref 39–117)
BUN: 7 mg/dL (ref 6–23)
CO2: 25 mEq/L (ref 19–32)
Calcium: 9.6 mg/dL (ref 8.4–10.5)
Chloride: 101 mEq/L (ref 96–112)
Creatinine, Ser: 0.68 mg/dL (ref 0.40–1.20)
GFR: 108.33 mL/min (ref >60.00)
Glucose, Bld: 79 mg/dL (ref 70–99)
Potassium: 3.5 mEq/L (ref 3.5–5.1)
Sodium: 137 mEq/L (ref 135–145)
Total Bilirubin: 0.8 mg/dL (ref 0.2–1.2)
Total Protein: 8 g/dL (ref 6.0–8.3)

## 2021-09-15 LAB — VITAMIN D 25 HYDROXY (VIT D DEFICIENCY, FRACTURES): VITD: 31.41 ng/mL (ref 30.00–100.00)

## 2021-09-15 LAB — VITAMIN B12: Vitamin B-12: 1064 pg/mL — ABNORMAL HIGH (ref 211–911)

## 2021-09-15 NOTE — Patient Instructions (Signed)
-  Nice seeing you today!!  -Lab work today; will notify you once results are available.  -Will send you to see cardiology for your palpitations.

## 2021-09-15 NOTE — Progress Notes (Signed)
Established Patient Office Visit     This visit occurred during the SARS-CoV-2 public health emergency.  Safety protocols were in place, including screening questions prior to the visit, additional usage of staff PPE, and extensive cleaning of exam room while observing appropriate contact time as indicated for disinfecting solutions.    CC/Reason for Visit: Discuss some concerns  HPI: Lauren Zuniga is a 40 y.o. female who is coming in today for the above mentioned reasons. Past Medical History is significant for: Hypertension, vitamin D deficiency and prior history of iron deficiency anemia.  She has been having some fatigue and palpitations for about a year.  She had not mentioned the palpitations to me previously.  She feels like she sleeps well, wakes up well rested, has never been told that she snores, does not have midnight awakenings.  She feels like the palpitations happen at least weekly.  She can sometimes feel them in her throat.  She denies true chest pain or shortness of breath.   Past Medical/Surgical History: Past Medical History:  Diagnosis Date   Hypertension     No past surgical history on file.  Social History:  reports that she has never smoked. She has never used smokeless tobacco. She reports that she does not drink alcohol and does not use drugs.  Allergies: No Known Allergies  Family History:  Family History  Problem Relation Age of Onset   Cancer Paternal Grandmother      Current Outpatient Medications:    ferrous sulfate 325 (65 FE) MG EC tablet, Take 325 mg by mouth 3 (three) times daily with meals., Disp: , Rfl:    hydrochlorothiazide (HYDRODIURIL) 25 MG tablet, Take 1 tablet (25 mg total) by mouth daily., Disp: 90 tablet, Rfl: 1   Multiple Vitamins-Calcium (ONE-A-DAY WOMENS FORMULA PO), Take by mouth., Disp: , Rfl:   Review of Systems:  Constitutional: Denies fever, chills, diaphoresis, appetite change. HEENT: Denies photophobia, eye  pain, redness, hearing loss, ear pain, congestion, sore throat, rhinorrhea, sneezing, mouth sores, trouble swallowing, neck pain, neck stiffness and tinnitus.   Respiratory: Denies SOB, DOE, cough, chest tightness,  and wheezing.   Cardiovascular: Denies chest pain and leg swelling.  Gastrointestinal: Denies nausea, vomiting, abdominal pain, diarrhea, constipation, blood in stool and abdominal distention.  Genitourinary: Denies dysuria, urgency, frequency, hematuria, flank pain and difficulty urinating.  Endocrine: Denies: hot or cold intolerance, sweats, changes in hair or nails, polyuria, polydipsia. Musculoskeletal: Denies myalgias, back pain, joint swelling, arthralgias and gait problem.  Skin: Denies pallor, rash and wound.  Neurological: Denies dizziness, seizures, syncope, weakness, light-headedness, numbness and headaches.  Hematological: Denies adenopathy. Easy bruising, personal or family bleeding history  Psychiatric/Behavioral: Denies suicidal ideation, mood changes, confusion, nervousness, sleep disturbance and agitation    Physical Exam: Vitals:   09/15/21 0958  BP: 110/80  Pulse: 75  Temp: 98.4 F (36.9 C)  TempSrc: Oral  SpO2: 99%  Weight: 201 lb 3.2 oz (91.3 kg)    Body mass index is 34.54 kg/m.   Constitutional: NAD, calm, comfortable Eyes: PERRL, lids and conjunctivae normal ENMT: Mucous membranes are moist.  Respiratory: clear to auscultation bilaterally, no wheezing, no crackles. Normal respiratory effort. No accessory muscle use.  Cardiovascular: Regular rate and rhythm, no murmurs / rubs / gallops. No extremity edema.  Neurologic: Grossly intact and nonfocal Psychiatric: Normal judgment and insight. Alert and oriented x 3. Normal mood.    Impression and Plan:  Palpitations -Check CBC, TSH. -EKG done in  office today and interpreted by myself as normal sinus rhythm at a rate of 68 with normal axis and no ST-T wave changes. -We will place referral to  cardiology today, suspect she would benefit from an event monitor.  Fatigue, unspecified type  - Plan:  Vitamin B12, VITAMIN D 25 Hydroxy (Vit-D Deficiency, Fractures), VITAMIN D 25 Hydroxy (Vit-D Deficiency, Fractures), Vitamin B12  Iron deficiency anemia, unspecified iron deficiency anemia type -Check hemoglobin today.  Vitamin D deficiency -Check levels today.  Primary hypertension -Blood pressure is well controlled.  Time spent: 31 minutes reviewing chart, interviewing and examining patient and formulating plan of care.   Patient Instructions  -Nice seeing you today!!  -Lab work today; will notify you once results are available.  -Will send you to see cardiology for your palpitations.   Chaya Jan, MD [facility]

## 2021-10-07 NOTE — Progress Notes (Signed)
Cardiology Office Note:    Date:  10/08/2021   ID:  Lauren Zuniga, Lauren Zuniga [birth year], MRN [id number]  PCP:  Philip Aspen, [name], MD   [facility] Providers Cardiologist:  Alverda Skeans, MD Referring MD: Philip Aspen, Estel*   Chief Complaint/Reason for Referral:  Palpitations  ASSESSMENT & PLAN:    Palpitations - Plan: EKG 12-Lead, LONG TERM MONITOR (3-14 DAYS), ECHOCARDIOGRAM COMPLETE  Will obtain echocardiogram and 7-day monitor.  I will keep follow-up with me open-ended depending on results of the studies.       Dispo:  No follow-ups on file.      Medication Adjustments/Labs and Tests Ordered: Current medicines are reviewed at length with the patient today.  Concerns regarding medicines are outlined above.    Tests Ordered: Orders Placed This Encounter  Procedures   LONG TERM MONITOR (3-14 DAYS)   EKG 12-Lead   ECHOCARDIOGRAM COMPLETE      Medication Changes: No orders of the defined types were placed in this encounter.    History of Present Illness:    The patient is a 40 y.o. female with the indicated medical history here for palpitations.  The patient was recently seen by her primary care provider and reported palpitations.  They have been going on for about a year.  They happen about once a week.  Laboratories including TSH as well as EKG were unremarkable. She tells me she gets palpitations on occasion that lasts maybe a few minutes.  They are associated with some chest tightness but then the tightness goes away when the palpitations go away.  She does not describe chest tightness with exertion.  The palpitations seem to occur on days after she does not get good sleep.  She denies any paroxysmal, dyspnea, orthopnea, peripheral edema, or exertional dyspnea.  She is required no emergency room visits or hospitalizations.   Previous Medical History: Past Medical History:  Diagnosis Date   Chest pain    Fatigue    Hypertension    Polyuria     Vitamin D deficiency      Current Medications: Current Meds  Medication Sig   ferrous sulfate 325 (65 FE) MG EC tablet Take 325 mg by mouth 3 (three) times daily with meals.   hydrochlorothiazide (HYDRODIURIL) 25 MG tablet Take 1 tablet (25 mg total) by mouth daily.   Multiple Vitamins-Calcium (ONE-A-DAY WOMENS FORMULA PO) Take by mouth.     Allergies:    Patient has no known allergies.   Social History:   Social History   Tobacco Use   Smoking status: Never   Smokeless tobacco: Never  Vaping Use   Vaping Use: Never used  Substance Use Topics   Alcohol use: Never   Drug use: Never     Family Hx: Family History  Problem Relation Age of Onset   Cancer Paternal Grandmother      Review of Systems:   Please see the history of present illness.    All other systems reviewed and are negative.  EKGs/Labs/Other Test Reviewed:    EKG:  EKG today: Sinus rhythm; prior EKG: Sinus rhythm November 2022  Prior CV studies: None performed  Imaging studies that I have independently reviewed today: None performed  Recent Labs: 09/15/2021: ALT 14; BUN 7; Creatinine, Ser 0.68; Hemoglobin 13.2; Platelets 264.0; Potassium 3.5; Sodium 137; TSH 0.70   Recent Lipid Panel Lab Results  Component Value Date/Time   CHOL 143 01/28/2021 09:30 AM   TRIG 47.0 01/28/2021 09:30 AM  HDL 51.00 01/28/2021 09:30 AM   LDLCALC 83 01/28/2021 09:30 AM     Risk Assessment/Calculations:           Physical Exam:    VS:  BP 110/84   Pulse 86   Ht 5\' 5"  (1.651 m)   Wt 201 lb (91.2 kg)   SpO2 98%   BMI 33.45 kg/m    Wt Readings from Last 3 Encounters:  10/08/21 201 lb (91.2 kg)  09/15/21 201 lb 3.2 oz (91.3 kg)  03/05/21 200 lb 4.8 oz (90.9 kg)    GENERAL:  No apparent distress, AOx3 HEENT:  No carotid bruits, +2 carotid impulses, no scleral icterus CAR: RRR, no murmurs, gallops, rubs, or thrills RES:  Clear to auscultation bilaterally ABD:  Soft, nontender, nondistended, positive  bowel sounds x 4 VASC:  +2 radial pulses, +2 carotid pulses, palpable pedal pulses NEURO:  CN 2-12 grossly intact; motor and sensory grossly intact PSYCH:  No active depression or anxiety EXT:  No edema, ecchymosis, or cyanosis    Signed, 05/05/21, MD  10/08/2021 10:06 AM    [facility] [address] [place], [place], [place]  [place] Phone: [phone]; Fax: [phone]

## 2021-10-08 ENCOUNTER — Other Ambulatory Visit: Payer: Self-pay

## 2021-10-08 ENCOUNTER — Ambulatory Visit (INDEPENDENT_AMBULATORY_CARE_PROVIDER_SITE_OTHER): Payer: 59

## 2021-10-08 ENCOUNTER — Ambulatory Visit (INDEPENDENT_AMBULATORY_CARE_PROVIDER_SITE_OTHER): Payer: 59 | Admitting: Internal Medicine

## 2021-10-08 ENCOUNTER — Encounter: Payer: Self-pay | Admitting: Internal Medicine

## 2021-10-08 VITALS — BP 110/84 | HR 86 | Ht 65.0 in | Wt 201.0 lb

## 2021-10-08 DIAGNOSIS — R002 Palpitations: Secondary | ICD-10-CM

## 2021-10-08 NOTE — Patient Instructions (Addendum)
Medication Instructions:  No changes *If you need a refill on your cardiac medications before your next appointment, please call your pharmacy*   Lab Work: none  Testing/Procedures: Your physician has requested that you have an echocardiogram. Echocardiography is a painless test that uses sound waves to create images of your heart. It provides your doctor with information about the size and shape of your heart and how well your heart's chambers and valves are working. This procedure takes approximately one hour. There are no restrictions for this procedure.   Follow-Up: As needed   Other Instructions ZIO XT- Long Term Monitor Instructions  Your physician has requested you wear a ZIO patch monitor for 7 days.  This is a single patch monitor. [employer] supplies one patch monitor per enrollment. Additional stickers are not available. Please do not apply patch if you will be having a Nuclear Stress Test,  Echocardiogram, Cardiac CT, MRI, or Chest Xray during the period you would be wearing the  monitor. The patch cannot be worn during these tests. You cannot remove and re-apply the  ZIO XT patch monitor.  Your ZIO patch monitor will be mailed 3 day USPS to your address on file. It may take 3-5 days  to receive your monitor after you have been enrolled.  Once you have received your monitor, please review the enclosed instructions. Your monitor  has already been registered assigning a specific monitor serial # to you.  Billing and Patient Assistance Program Information  We have supplied [employer] with any of your insurance information on file for billing purposes. [employer] offers a sliding scale Patient Assistance Program for patients that do not have  insurance, or whose insurance does not completely cover the cost of the ZIO monitor.  You must apply for the Patient Assistance Program to qualify for this discounted rate.  To apply, please call [employer] at [phone], select option 4,  select option 2, ask to apply for  Patient Assistance Program. Meredeth Ide will ask your household income, and how many people  are in your household. They will quote your out-of-pocket cost based on that information.  [employer] will also be able to set up a 64-month, interest-free payment plan if needed.  Applying the monitor   Shave hair from upper left chest.  Hold abrader disc by orange tab. Rub abrader in 40 strokes over the upper left chest as  indicated in your monitor instructions.  Clean area with 4 enclosed alcohol pads. Let dry.  Apply patch as indicated in monitor instructions. Patch will be placed under collarbone on left  side of chest with arrow pointing upward.  Rub patch adhesive wings for 2 minutes. Remove white label marked "1". Remove the white  label marked "2". Rub patch adhesive wings for 2 additional minutes.  While looking in a mirror, press and release button in center of patch. A small green light will  flash 3-4 times. This will be your only indicator that the monitor has been turned on.  Do not shower for the first 24 hours. You may shower after the first 24 hours.  Press the button if you feel a symptom. You will hear a small click. Record Date, Time and  Symptom in the Patient Logbook.  When you are ready to remove the patch, follow instructions on the last 2 pages of Patient  Logbook. Stick patch monitor onto the last page of Patient Logbook.  Place Patient Logbook in the blue and white box. Use locking tab on box and  tape box closed  securely. The blue and white box has prepaid postage on it. Please place it in the mailbox as  soon as possible. Your physician should have your test results approximately 7 days after the  monitor has been mailed back to [facility].  Call [facility] Customer Care at [phone] if you have questions regarding  your ZIO XT patch monitor. Call them immediately if you see an orange light blinking on your  monitor.  If your  monitor falls off in less than 4 days, contact our Monitor department at [phone].  If your monitor becomes loose or falls off after 4 days call [employer] at [phone] for  suggestions on securing your monitor

## 2021-10-08 NOTE — Progress Notes (Unsigned)
Enrolled for [employer] to mail a ZIO XT long term holter monitor to the patients address on file.  

## 2021-10-11 ENCOUNTER — Ambulatory Visit: Payer: 59

## 2021-10-12 DIAGNOSIS — R002 Palpitations: Secondary | ICD-10-CM | POA: Diagnosis not present

## 2021-10-29 ENCOUNTER — Ambulatory Visit (HOSPITAL_COMMUNITY): Payer: 59 | Attending: Cardiovascular Disease

## 2021-10-29 ENCOUNTER — Telehealth: Payer: Self-pay | Admitting: Internal Medicine

## 2021-10-29 ENCOUNTER — Other Ambulatory Visit: Payer: Self-pay

## 2021-10-29 DIAGNOSIS — R002 Palpitations: Secondary | ICD-10-CM | POA: Insufficient documentation

## 2021-10-29 DIAGNOSIS — I10 Essential (primary) hypertension: Secondary | ICD-10-CM

## 2021-10-29 LAB — ECHOCARDIOGRAM COMPLETE
Area-P 1/2: 4.63 cm2
S' Lateral: 2.8 cm

## 2021-10-29 NOTE — Telephone Encounter (Signed)
Patient is returning call.  °

## 2021-10-29 NOTE — Telephone Encounter (Signed)
Orbie Pyo, MD  10/28/2021  1:28 PM EST     Let her know monitor showed occasional extra heart beats from the top of the heart that are not worrisome or dangerous.  Spoke with patient and relayed postitive (above) results of cardiac monitor. Pt condones understanding and will continue with plan of care. Maralyn Sago, RN

## 2021-10-29 NOTE — Telephone Encounter (Signed)
Attempted to call patient back r/t monitor results, No answer. LM to call back 10/29/21. Maralyn Sago, RN

## 2021-10-29 NOTE — Telephone Encounter (Signed)
Patient was returning call for results. Please advise °

## 2021-11-16 ENCOUNTER — Other Ambulatory Visit: Payer: Self-pay | Admitting: Internal Medicine

## 2021-11-16 DIAGNOSIS — I10 Essential (primary) hypertension: Secondary | ICD-10-CM

## 2021-12-27 IMAGING — MG DIGITAL SCREENING BILAT W/ CAD
4 series · 4 of 4 positions shown · non-contrast
Comparison: None.

CLINICAL DATA: Screening.

EXAM:
DIGITAL SCREENING BILATERAL MAMMOGRAM WITH CAD
TECHNIQUE: Bilateral screening digital craniocaudal and mediolateral oblique
mammograms were obtained. The images were evaluated with
computer-aided detection.

[R CC]
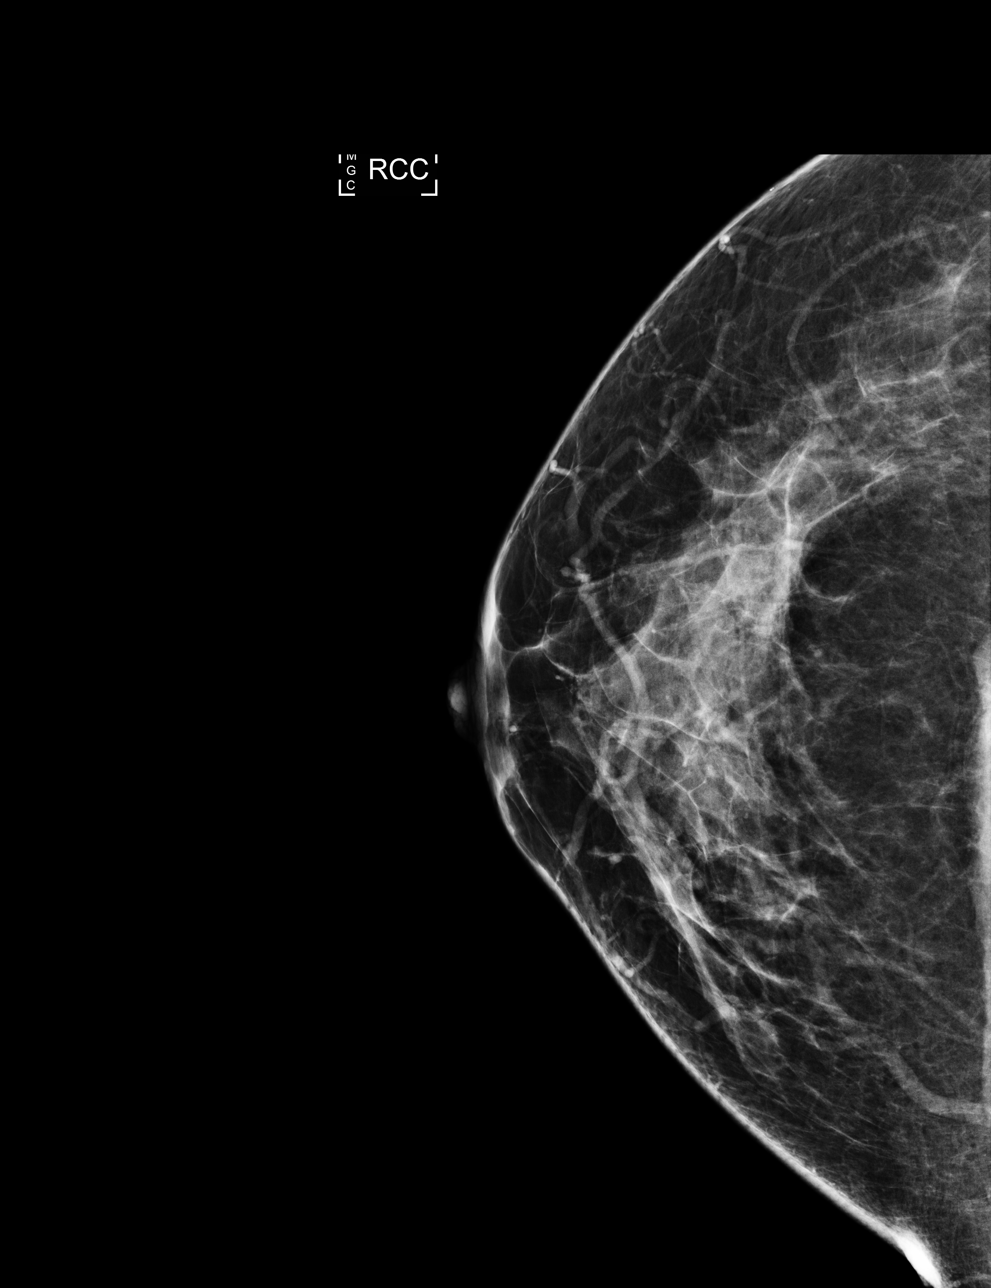

[L CC]
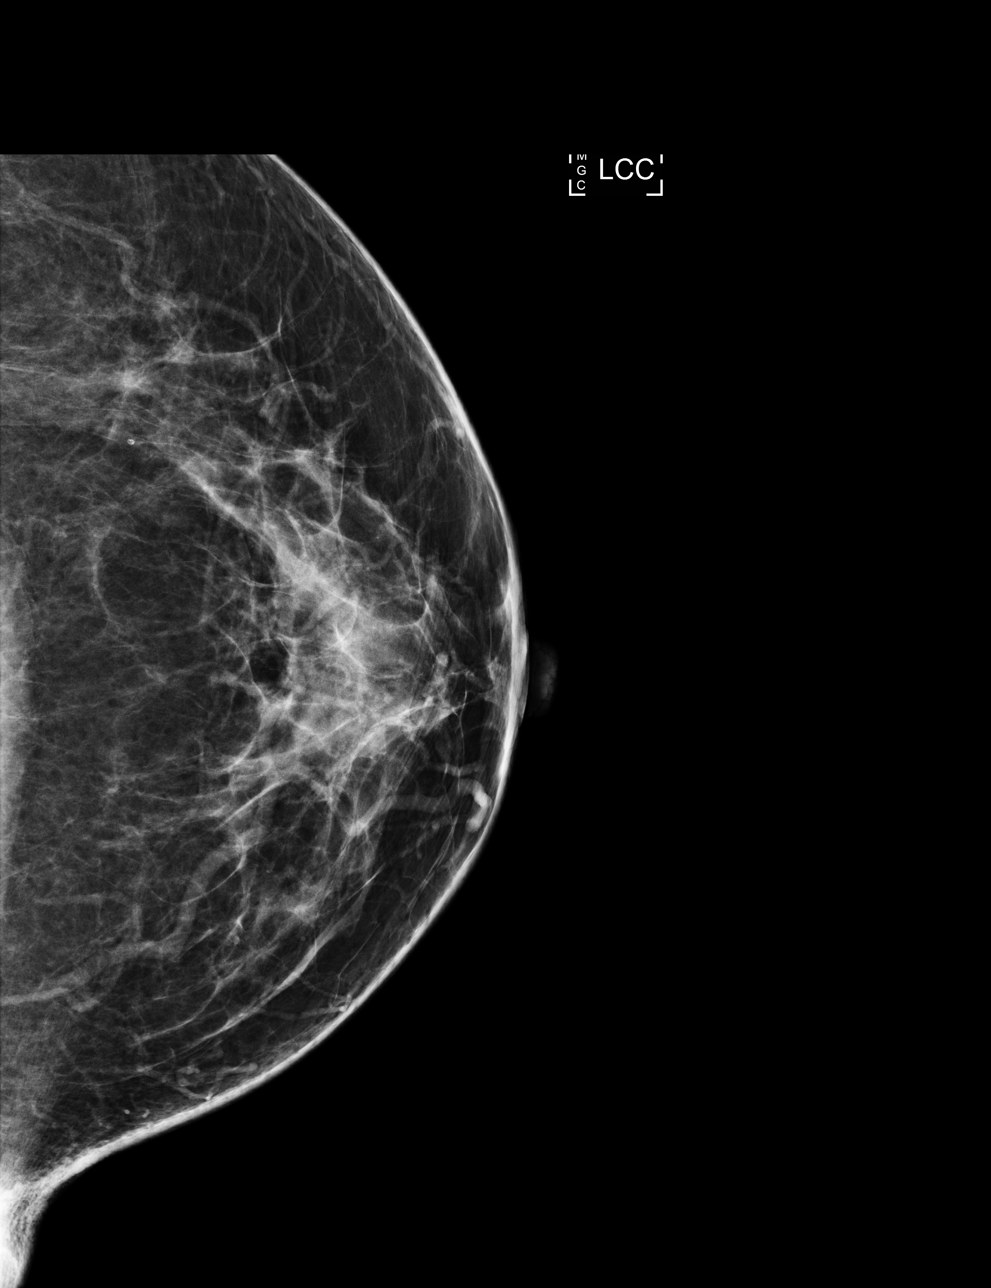

[R MLO]
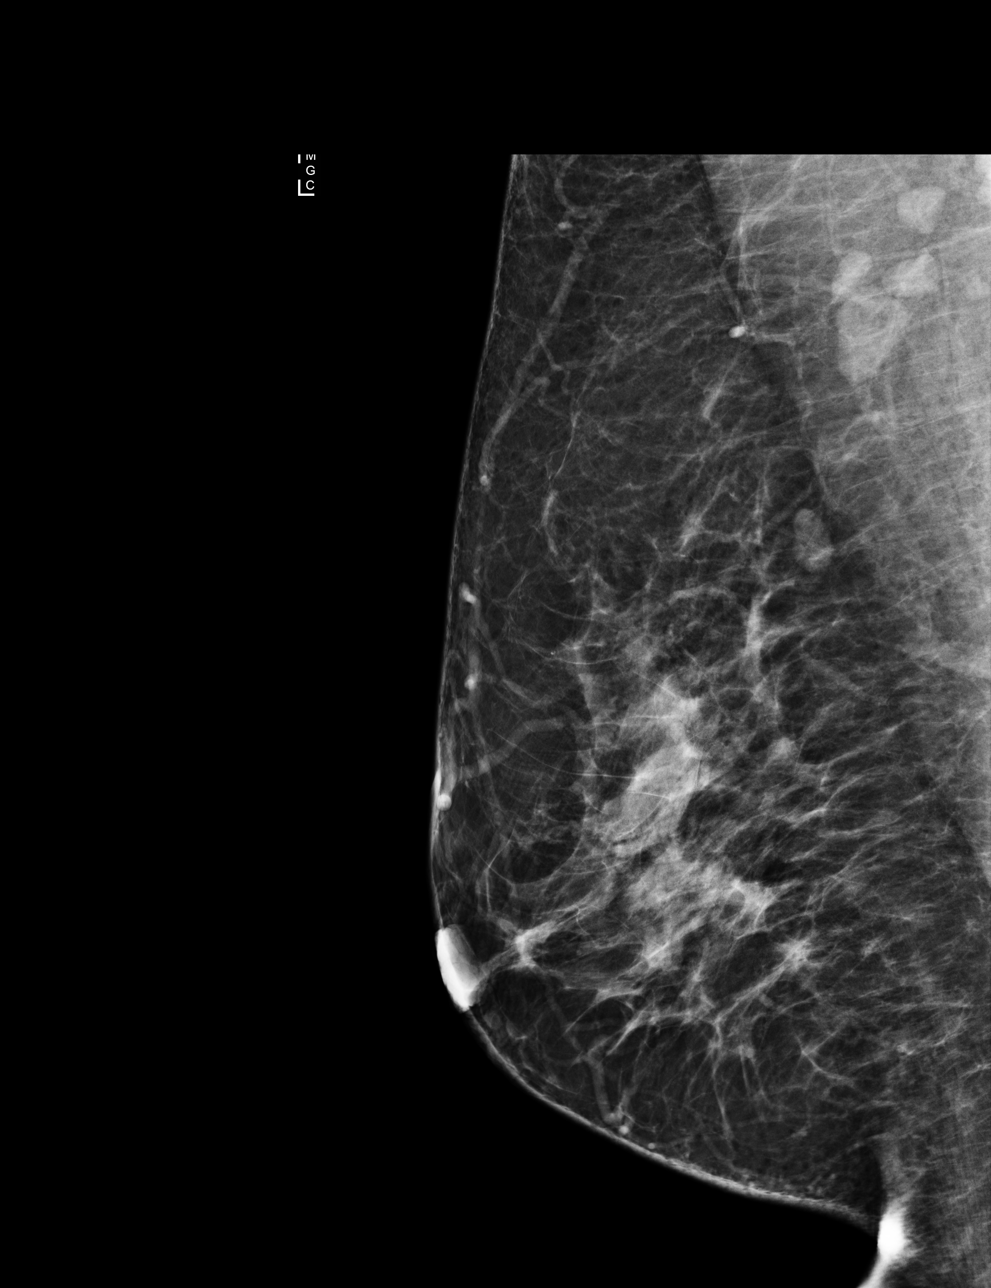

[L MLO]
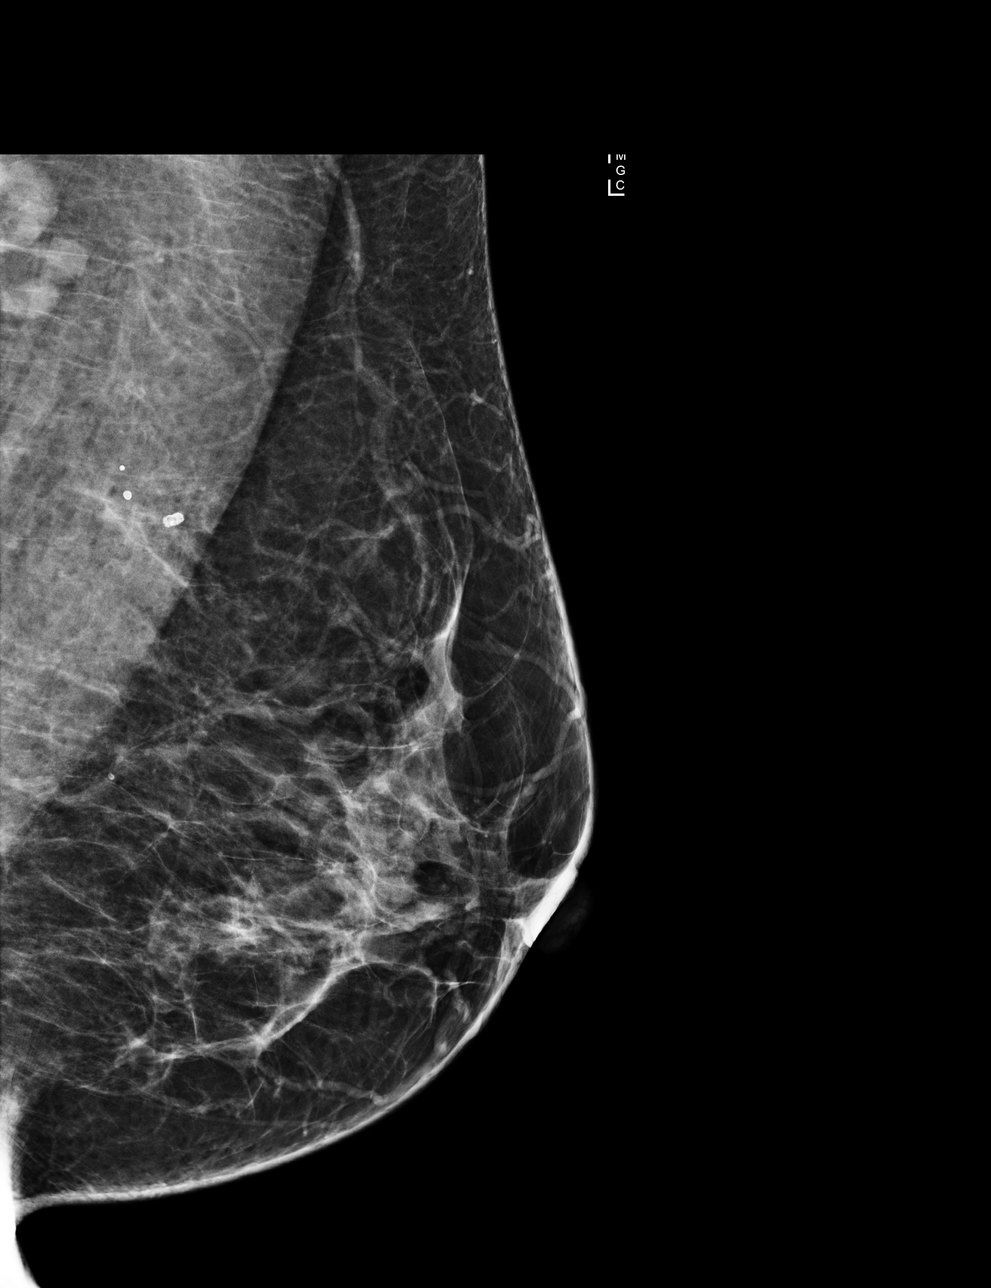

[4 of 4 positions shown; findings below may reference images not displayed]

ACR Breast Density Category c: The breast tissue is heterogeneously
dense, which may obscure small masses
FINDINGS: There are no findings suspicious for malignancy. The images were
evaluated with computer-aided detection.
IMPRESSION: No mammographic evidence of malignancy. A result letter of this
screening mammogram will be mailed directly to the patient.

RECOMMENDATION:
Screening mammogram in one year. (Code:69-V-IU6)

BI-RADS CATEGORY  1: Negative.

## 2022-04-11 ENCOUNTER — Other Ambulatory Visit: Payer: Self-pay | Admitting: Internal Medicine

## 2022-04-11 DIAGNOSIS — Z1231 Encounter for screening mammogram for malignant neoplasm of breast: Secondary | ICD-10-CM

## 2022-05-27 ENCOUNTER — Ambulatory Visit
Admission: RE | Admit: 2022-05-27 | Discharge: 2022-05-27 | Disposition: A | Discharge status: Home / Self Care | Payer: 59 | Source: Ambulatory Visit | Attending: Internal Medicine | Admitting: Internal Medicine

## 2022-05-27 DIAGNOSIS — Z1231 Encounter for screening mammogram for malignant neoplasm of breast: Secondary | ICD-10-CM | POA: Diagnosis not present

## 2023-03-31 DIAGNOSIS — Z113 Encounter for screening for infections with a predominantly sexual mode of transmission: Secondary | ICD-10-CM | POA: Diagnosis not present

## 2023-03-31 DIAGNOSIS — R12 Heartburn: Secondary | ICD-10-CM | POA: Diagnosis not present

## 2023-03-31 DIAGNOSIS — Z131 Encounter for screening for diabetes mellitus: Secondary | ICD-10-CM | POA: Diagnosis not present

## 2023-03-31 DIAGNOSIS — Z01419 Encounter for gynecological examination (general) (routine) without abnormal findings: Secondary | ICD-10-CM | POA: Diagnosis not present

## 2023-03-31 DIAGNOSIS — G4700 Insomnia, unspecified: Secondary | ICD-10-CM | POA: Diagnosis not present

## 2023-03-31 DIAGNOSIS — Z30431 Encounter for routine checking of intrauterine contraceptive device: Secondary | ICD-10-CM | POA: Diagnosis not present

## 2023-03-31 DIAGNOSIS — Z114 Encounter for screening for human immunodeficiency virus [HIV]: Secondary | ICD-10-CM | POA: Diagnosis not present

## 2023-05-19 ENCOUNTER — Encounter (HOSPITAL_COMMUNITY): Payer: Self-pay | Admitting: *Deleted

## 2023-05-19 ENCOUNTER — Other Ambulatory Visit: Payer: Self-pay

## 2023-05-19 ENCOUNTER — Emergency Department (HOSPITAL_COMMUNITY)
Admission: EM | Admit: 2023-05-19 | Discharge: 2023-05-19 | Disposition: A | Discharge status: Home / Self Care | Payer: Medicaid Other | Attending: Emergency Medicine | Admitting: Emergency Medicine

## 2023-05-19 DIAGNOSIS — R519 Headache, unspecified: Secondary | ICD-10-CM | POA: Diagnosis not present

## 2023-05-19 DIAGNOSIS — S61411A Laceration without foreign body of right hand, initial encounter: Secondary | ICD-10-CM | POA: Diagnosis not present

## 2023-05-19 DIAGNOSIS — Y92410 Unspecified street and highway as the place of occurrence of the external cause: Secondary | ICD-10-CM | POA: Insufficient documentation

## 2023-05-19 DIAGNOSIS — S65301A Unspecified injury of deep palmar arch of right hand, initial encounter: Secondary | ICD-10-CM | POA: Diagnosis not present

## 2023-05-19 DIAGNOSIS — Z23 Encounter for immunization: Secondary | ICD-10-CM | POA: Diagnosis not present

## 2023-05-19 MED ORDER — TETANUS-DIPHTH-ACELL PERTUSSIS 5-2.5-18.5 LF-MCG/0.5 IM SUSY
0.5000 mL | PREFILLED_SYRINGE | Freq: Once | INTRAMUSCULAR | Status: AC
  Administered 2023-05-19: 0.5 mL via INTRAMUSCULAR
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
## 2023-05-19 NOTE — Discharge Instructions (Addendum)
You will hurt worse tomorrow.  That is normal. Follow up with your family doc in the office.  Return for confusion, vomiting, sudden worsening headache.    You can apply an ointment to the cut a couple times a day.  Clean with soap and water.  Return for redness, drainage, fever.
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
